# Patient Record
Sex: Female | Born: 1980 | Race: Black or African American | Hispanic: No | Marital: Married | Smoking: Never smoker
Health system: Southern US, Community
[De-identification: ages and names within clinical notes are randomized; demographics above are authoritative.]

## PROBLEM LIST (undated history)

## (undated) ENCOUNTER — Inpatient Hospital Stay (HOSPITAL_COMMUNITY): Payer: Self-pay

## (undated) DIAGNOSIS — Z789 Other specified health status: Secondary | ICD-10-CM

## (undated) DIAGNOSIS — Z973 Presence of spectacles and contact lenses: Secondary | ICD-10-CM

## (undated) DIAGNOSIS — J40 Bronchitis, not specified as acute or chronic: Secondary | ICD-10-CM

## (undated) DIAGNOSIS — U071 COVID-19: Secondary | ICD-10-CM

## (undated) HISTORY — PX: CERVICAL BIOPSY  W/ LOOP ELECTRODE EXCISION: SUR135

## (undated) HISTORY — PX: NO PAST SURGERIES: SHX2092

---

## 2011-12-06 ENCOUNTER — Encounter (HOSPITAL_COMMUNITY): Payer: Self-pay | Admitting: *Deleted

## 2011-12-06 ENCOUNTER — Emergency Department (INDEPENDENT_AMBULATORY_CARE_PROVIDER_SITE_OTHER): Payer: Self-pay

## 2011-12-06 ENCOUNTER — Emergency Department (INDEPENDENT_AMBULATORY_CARE_PROVIDER_SITE_OTHER)
Admission: EM | Admit: 2011-12-06 | Discharge: 2011-12-06 | Disposition: A | Discharge status: Home / Self Care | Payer: Self-pay | Source: Home / Self Care | Attending: Emergency Medicine | Admitting: Emergency Medicine

## 2011-12-06 DIAGNOSIS — J209 Acute bronchitis, unspecified: Secondary | ICD-10-CM

## 2011-12-06 HISTORY — DX: Bronchitis, not specified as acute or chronic: J40

## 2011-12-06 MED ORDER — BENZONATATE 200 MG PO CAPS: 200.0000 mg | ORAL_CAPSULE | Freq: Three times a day (TID) | ORAL | Status: DC | PRN

## 2011-12-06 MED ORDER — AMOXICILLIN 500 MG PO CAPS: 1000.0000 mg | ORAL_CAPSULE | Freq: Three times a day (TID) | ORAL | Status: DC

## 2011-12-06 NOTE — ED Notes (Signed)
Cough with slight fever onset the beginning of last week.  Also had body aches and hemoptysis today x 30.

## 2011-12-06 NOTE — ED Provider Notes (Signed)
Chief Complaint  Patient presents with  . Cough    History of Present Illness:   The patient is a 31 year old female who comes in today with her 2 sick children who have the same symptoms. She describes a one-week history of chills, myalgias, cough productive of sputum which is blood-tinged, chest tightness, wheezing, and sore throat. She denies any fever, nasal congestion, rhinorrhea, or GI symptoms.  Review of Systems:  Other than noted above, the patient denies any of the following symptoms. Systemic:  No fever, chills, sweats, fatigue, myalgias, headache, or anorexia. Eye:  No redness, pain or drainage. ENT:  No earache, ear congestion, nasal congestion, sneezing, rhinorrhea, sinus pressure, sinus pain, post nasal drip, or sore throat. Lungs:  No cough, sputum production, wheezing, shortness of breath, or chest pain. GI:  No abdominal pain, nausea, vomiting, or diarrhea.  PMFSH:  Past medical history, family history, social history, meds, and allergies were reviewed.  Physical Exam:   Vital signs:  BP 111/75  Pulse 92  Temp 98.2 F (36.8 C) (Oral)  SpO2 99%  LMP 11/26/2011 General:  Alert, in no distress. Eye:  No conjunctival injection or drainage. Lids were normal. ENT:  TMs and canals were normal, without erythema or inflammation.  Nasal mucosa was clear and uncongested, without drainage.  Mucous membranes were moist.  Pharynx was clear, without exudate or drainage.  There were no oral ulcerations or lesions. Neck:  Supple, no adenopathy, tenderness or mass. Lungs:  No respiratory distress.  Lungs were clear to auscultation, without wheezes, rales or rhonchi.  Breath sounds were clear and equal bilaterally.  Heart:  Regular rhythm, without gallops, murmers or rubs. Skin:  Clear, warm, and dry, without rash or lesions.  Radiology:  Dg Chest 2 View  12/06/2011  *RADIOLOGY REPORT*  Clinical Data: Hemoptysis.  CHEST - 2 VIEW  Comparison: None.  Findings: Cardiomediastinal  silhouette appears normal.  No acute pulmonary disease is noted.  Bony thorax is intact.  IMPRESSION: No acute cardiopulmonary abnormality seen.   Original Report Authenticated By: Lupita Raider.,  M.D.    I reviewed the images independently and personally and concur with the radiologist's findings.  Assessment:  The encounter diagnosis was Acute bronchitis.  Plan:   1.  The following meds were prescribed:   New Prescriptions   AMOXICILLIN (AMOXIL) 500 MG CAPSULE    Take 2 capsules (1,000 mg total) by mouth 3 (three) times daily.   BENZONATATE (TESSALON) 200 MG CAPSULE    Take 1 capsule (200 mg total) by mouth 3 (three) times daily as needed for cough.   2.  The patient was instructed in symptomatic care and handouts were given. 3.  The patient was told to return if becoming worse in any way, if no better in 3 or 4 days, and given some red flag symptoms that would indicate earlier return.   Reuben Likes, MD 12/06/11 2209

## 2017-07-20 ENCOUNTER — Inpatient Hospital Stay (HOSPITAL_COMMUNITY)
Admission: AD | Admit: 2017-07-20 | Discharge: 2017-07-20 | Disposition: A | Discharge status: Home / Self Care | Payer: Medicaid Other | Source: Ambulatory Visit | Attending: Obstetrics and Gynecology | Admitting: Obstetrics and Gynecology

## 2017-07-20 ENCOUNTER — Inpatient Hospital Stay (HOSPITAL_COMMUNITY): Payer: Medicaid Other

## 2017-07-20 ENCOUNTER — Encounter (HOSPITAL_COMMUNITY): Payer: Self-pay | Admitting: *Deleted

## 2017-07-20 DIAGNOSIS — O209 Hemorrhage in early pregnancy, unspecified: Secondary | ICD-10-CM | POA: Diagnosis not present

## 2017-07-20 DIAGNOSIS — O3680X0 Pregnancy with inconclusive fetal viability, not applicable or unspecified: Secondary | ICD-10-CM

## 2017-07-20 DIAGNOSIS — Z3A01 Less than 8 weeks gestation of pregnancy: Secondary | ICD-10-CM | POA: Diagnosis not present

## 2017-07-20 HISTORY — DX: Other specified health status: Z78.9

## 2017-07-20 LAB — HCG, QUANTITATIVE, PREGNANCY: hCG, Beta Chain, Quant, S: 1150 m[IU]/mL — ABNORMAL HIGH (ref <5)

## 2017-07-20 LAB — URINALYSIS, ROUTINE W REFLEX MICROSCOPIC
Bacteria, UA: NONE SEEN
Bilirubin Urine: NEGATIVE
Glucose, UA: NEGATIVE mg/dL
Ketones, ur: NEGATIVE mg/dL
Leukocytes, UA: NEGATIVE
Nitrite: NEGATIVE
Protein, ur: NEGATIVE mg/dL
Specific Gravity, Urine: 1.017 (ref 1.005–1.030)
pH: 5 (ref 5.0–8.0)

## 2017-07-20 LAB — CBC
HCT: 37.8 % (ref 36.0–46.0)
Hemoglobin: 12.6 g/dL (ref 12.0–15.0)
MCH: 29 pg (ref 26.0–34.0)
MCHC: 33.3 g/dL (ref 30.0–36.0)
MCV: 86.9 fL (ref 78.0–100.0)
Platelets: 221 10*3/uL (ref 150–400)
RBC: 4.35 MIL/uL (ref 3.87–5.11)
RDW: 13.1 % (ref 11.5–15.5)
WBC: 7 10*3/uL (ref 4.0–10.5)

## 2017-07-20 LAB — WET PREP, GENITAL
Clue Cells Wet Prep HPF POC: NONE SEEN
Sperm: NONE SEEN
Trich, Wet Prep: NONE SEEN
Yeast Wet Prep HPF POC: NONE SEEN

## 2017-07-20 LAB — ABO/RH: ABO/RH(D): AB POS

## 2017-07-20 LAB — POCT PREGNANCY, URINE: Preg Test, Ur: POSITIVE — AB

## 2017-07-20 NOTE — MAU Note (Signed)
Gina Finneratricia Welch is a 37 y.o. at Unknown here in MAU reporting:  +vaginal bleeding Spotting in nature; noticeable when wipes Brown in color Last intercourse in past 24 hours LMP: 06/15/17 Onset of complaint: this am Pain score: denies at this time Vitals:   07/20/17 0837  BP: 129/79  Pulse: 87  Resp: 18  Temp: 97.8 F (36.6 C)  SpO2: 98%      Lab orders placed from triage: ua & pregnancy test

## 2017-07-20 NOTE — Discharge Instructions (Signed)
Ectopic Pregnancy An ectopic pregnancy is when the fertilized egg attaches (implants) outside the uterus. Most ectopic pregnancies occur in one of the tubes where eggs travel from the ovary to the uterus (fallopian tubes), but the implanting can occur in other locations. In rare cases, ectopic pregnancies occur on the ovary, intestine, pelvis, abdomen, or cervix. In an ectopic pregnancy, the fertilized egg does not have the ability to develop into a normal, healthy baby. A ruptured ectopic pregnancy is one in which tearing or bursting of a fallopian tube causes internal bleeding. Often, there is intense lower abdominal pain, and vaginal bleeding sometimes occurs. Having an ectopic pregnancy can be life-threatening. If this dangerous condition is not treated, it can lead to blood loss, shock, or even death. What are the causes? The most common cause of this condition is damage to one of the fallopian tubes. A fallopian tube may be narrowed or blocked, and that keeps the fertilized egg from reaching the uterus. What increases the risk? This condition is more likely to develop in women of childbearing age who have different levels of risk. The levels of risk can be divided into three categories. High risk  You have gone through infertility treatment.  You have had an ectopic pregnancy before.  You have had surgery on the fallopian tubes, or another surgical procedure, such as an abortion.  You have had surgery to have the fallopian tubes tied (tubal ligation).  You have problems or diseases of the fallopian tubes.  You have been exposed to diethylstilbestrol (DES). This medicine was used until 1971, and it had effects on babies whose mothers took the medicine.  You become pregnant while using an IUD (intrauterine device) for birth control. Moderate risk  You have a history of infertility.  You have had an STI (sexually transmitted infection).  You have a history of pelvic inflammatory  disease (PID).  You have scarring from endometriosis.  You have multiple sexual partners.  You smoke. Low risk  You have had pelvic surgery.  You use vaginal douches.  You became sexually active before age 18. What are the signs or symptoms? Common symptoms of this condition include normal pregnancy symptoms, such as missing a period, nausea, tiredness, abdominal pain, breast tenderness, and bleeding. However, ectopic pregnancy will have additional symptoms, such as:  Pain with intercourse.  Irregular vaginal bleeding or spotting.  Cramping or pain on one side or in the lower abdomen.  Fast heartbeat, low blood pressure, and sweating.  Passing out while having a bowel movement.  Symptoms of a ruptured ectopic pregnancy and internal bleeding may include:  Sudden, severe pain in the abdomen and pelvis.  Dizziness, weakness, light-headedness, or fainting.  Pain in the shoulder or neck area.  How is this diagnosed? This condition is diagnosed by:  A pelvic exam to locate pain or a mass in the abdomen.  A pregnancy test. This blood test checks for the presence as well as the specific level of pregnancy hormone in the bloodstream.  Ultrasound. This is performed if a pregnancy test is positive. In this test, a probe is inserted into the vagina. The probe will detect a fetus, possibly in a location other than the uterus.  Taking a sample of uterus tissue (dilation and curettage, or D&C).  Surgery to perform a visual exam of the inside of the abdomen using a thin, lighted tube that has a tiny camera on the end (laparoscope).  Culdocentesis. This procedure involves inserting a needle at the top   of the vagina, behind the uterus. If blood is present in this area, it may indicate that a fallopian tube is torn.  How is this treated? This condition is treated with medicine or surgery. Medicine  An injection of a medicine (methotrexate) may be given to cause the pregnancy tissue  to be absorbed. This medicine may save your fallopian tube. It may be given if: ? The diagnosis is made early, with no signs of active bleeding. ? The fallopian tube has not ruptured. ? You are considered to be a good candidate for the medicine. Usually, pregnancy hormone blood levels are checked after methotrexate treatment. This is to be sure that the medicine is effective. It may take 4-6 weeks for the pregnancy to be absorbed. Most pregnancies will be absorbed by 3 weeks. Surgery  A laparoscope may be used to remove the pregnancy tissue.  If severe internal bleeding occurs, a larger cut (incision) may be made in the lower abdomen (laparotomy) to remove the fetus and placenta. This is done to stop the bleeding.  Part or all of the fallopian tube may be removed (salpingectomy) along with the fetus and placenta. The fallopian tube may also be repaired during the surgery.  In very rare circumstances, removal of the uterus (hysterectomy) may be required.  After surgery, pregnancy hormone testing may be done to be sure that there is no pregnancy tissue left. Whether your treatment is medicine or surgery, you may receive a Rho (D) immune globulin shot to prevent problems with any future pregnancy. This shot may be given if:  You are Rh-negative and the baby's father is Rh-positive.  You are Rh-negative and you do not know the Rh type of the baby's father.  Follow these instructions at home:  Rest and limit your activity after the procedure for as long as told by your health care provider.  Until your health care provider says that it is safe: ? Do not lift anything that is heavier than 10 lb (4.5 kg), or the limit that your health care provider tells you. ? Avoid physical exercise and any movement that requires effort (is strenuous).  To help prevent constipation: ? Eat a healthy diet that includes fruits, vegetables, and whole grains. ? Drink 6-8 glasses of water per day. Get help  right away if:  You develop worsening pain that is not relieved by medicine.  You have: ? A fever or chills. ? Vaginal bleeding. ? Redness and swelling at the incision site. ? Nausea and vomiting.  You feel dizzy or weak.  You feel light-headed or you faint. This information is not intended to replace advice given to you by your health care provider. Make sure you discuss any questions you have with your health care provider. Document Released: 01/26/2004 Document Revised: 08/17/2015 Document Reviewed: 07/20/2015 Elsevier Interactive Patient Education  2018 Elsevier Inc.  

## 2017-07-20 NOTE — MAU Note (Signed)
Blind swabs collected by cnm

## 2017-07-20 NOTE — MAU Provider Note (Signed)
History     CSN: [id number]  Arrival date and time: 07/20/17 4098   First Provider Initiated Contact with Patient 07/20/17 0900      Chief Complaint  Patient presents with  . Vaginal Bleeding   Vaginal Bleeding  The patient's primary symptoms include vaginal bleeding. The patient's pertinent negatives include no pelvic pain or vaginal discharge. This is a new problem. The current episode started today. The problem occurs intermittently. The problem has been unchanged. The patient is experiencing no pain. She is pregnant. Pertinent negatives include no dysuria, fever, frequency, nausea or vomiting. The vaginal discharge was brown. The vaginal bleeding is spotting. She has not been passing clots. She has not been passing tissue. The symptoms are aggravated by intercourse. She has tried nothing for the symptoms. She is sexually active. She uses nothing for contraception. Her menstrual history has been regular (LMP 06/15/17 (approx)).   Past Medical History:  Diagnosis Date  . Bronchitis   . Medical history non-contributory     Past Surgical History:  Procedure Laterality Date  . NO PAST SURGERIES      Family History  Problem Relation Age of Onset  . Hypertension Mother   . Cancer Maternal Grandmother   . Depression Paternal Grandfather     Social History   Tobacco Use  . Smoking status: Never Smoker  . Smokeless tobacco: Never Used  Substance Use Topics  . Alcohol use: No  . Drug use: No    Allergies: No Known Allergies  Medications Prior to Admission  Medication Sig Dispense Refill Last Dose  . amoxicillin (AMOXIL) 500 MG capsule Take 2 capsules (1,000 mg total) by mouth 3 (three) times daily. 60 capsule 0   . benzonatate (TESSALON) 200 MG capsule Take 1 capsule (200 mg total) by mouth 3 (three) times daily as needed for cough. 30 capsule 0     Review of Systems  Constitutional: Negative for fatigue and fever.  Gastrointestinal: Negative for nausea and vomiting.   Genitourinary: Positive for vaginal bleeding. Negative for dysuria, frequency, pelvic pain and vaginal discharge.   Physical Exam   Blood pressure 129/79, pulse 87, temperature 97.8 F (36.6 C), temperature source Oral, resp. rate 18, weight 163 lb (73.9 kg), last menstrual period 06/15/2017, SpO2 98 %.  Physical Exam  Nursing note and vitals reviewed. Constitutional: She is oriented to person, place, and time. She appears well-developed and well-nourished. No distress.  HENT:  Head: Normocephalic.  Cardiovascular: Normal rate.  Respiratory: Effort normal.  GI: Soft. There is no tenderness. There is no rebound.  Neurological: She is alert and oriented to person, place, and time.  Skin: Skin is warm and dry.  Psychiatric: She has a normal mood and affect.     Results for orders placed or performed during the hospital encounter of 07/20/17 (from the past 24 hour(s))  Urinalysis, Routine w reflex microscopic     Status: Abnormal   Collection Time: 07/20/17  8:43 AM  Result Value Ref Range   Color, Urine YELLOW YELLOW   APPearance CLEAR CLEAR   Specific Gravity, Urine 1.017 1.005 - 1.030   pH 5.0 5.0 - 8.0   Glucose, UA NEGATIVE NEGATIVE mg/dL   Hgb urine dipstick SMALL (A) NEGATIVE   Bilirubin Urine NEGATIVE NEGATIVE   Ketones, ur NEGATIVE NEGATIVE mg/dL   Protein, ur NEGATIVE NEGATIVE mg/dL   Nitrite NEGATIVE NEGATIVE   Leukocytes, UA NEGATIVE NEGATIVE   RBC / HPF 0-5 0 - 5 RBC/hpf   WBC, UA  0-5 0 - 5 WBC/hpf   Bacteria, UA NONE SEEN NONE SEEN   Squamous Epithelial / LPF 0-5 0 - 5   Mucus PRESENT   Wet prep, genital     Status: Abnormal   Collection Time: 07/20/17  9:13 AM  Result Value Ref Range   Yeast Wet Prep HPF POC NONE SEEN NONE SEEN   Trich, Wet Prep NONE SEEN NONE SEEN   Clue Cells Wet Prep HPF POC NONE SEEN NONE SEEN   WBC, Wet Prep HPF POC FEW (A) NONE SEEN   Sperm NONE SEEN   Pregnancy, urine POC     Status: Abnormal   Collection Time: 07/20/17  9:16 AM   Result Value Ref Range   Preg Test, Ur POSITIVE (A) NEGATIVE  CBC     Status: None   Collection Time: 07/20/17  9:27 AM  Result Value Ref Range   WBC 7.0 4.0 - 10.5 K/uL   RBC 4.35 3.87 - 5.11 MIL/uL   Hemoglobin 12.6 12.0 - 15.0 g/dL   HCT 16.1 09.6 - 04.5 %   MCV 86.9 78.0 - 100.0 fL   MCH 29.0 26.0 - 34.0 pg   MCHC 33.3 30.0 - 36.0 g/dL   RDW 40.9 81.1 - 91.4 %   Platelets 221 150 - 400 K/uL  hCG, quantitative, pregnancy     Status: Abnormal   Collection Time: 07/20/17  9:27 AM  Result Value Ref Range   hCG, Beta Chain, Quant, S 1,150 (H) <5 mIU/mL  ABO/Rh     Status: None (Preliminary result)   Collection Time: 07/20/17  9:27 AM  Result Value Ref Range   ABO/RH(D)      AB POS Performed at [facility], [address] [place], [place] [address]    US Ob Comp Less 14 Wks  Result Date: 07/20/2017 CLINICAL DATA:  Small amount of vaginal bleeding and right pelvic tenderness. Approximately 5 weeks and 0 days pregnant by last menstrual period. The patient is not sure of when the last menstrual period started. Quantitative beta HCG 1,150. EXAM: OBSTETRIC <14 WK [place] AND TRANSVAGINAL OB US TECHNIQUE: Both transabdominal and transvaginal ultrasound examinations were performed for complete evaluation of the gestation as well as the maternal uterus, adnexal regions, and pelvic cul-de-sac. Transvaginal technique was performed to assess early pregnancy. COMPARISON:  None. FINDINGS: Intrauterine gestational sac: Small oval fluid collection in the endometrial cavity with an appearance suggesting a small, early gestational sac. Yolk sac:  Not visualized Embryo:  Not visualized MSD: 2.3 mm   4 w   6 d Subchorionic hemorrhage:  None visualized. Maternal uterus/adnexae: 3.7 cm simple right ovarian corpus luteum cyst. Normal appearing left ovary. Trace amount of free peritoneal fluid. IMPRESSION: Probable early intrauterine gestational sac, but no yolk sac, fetal pole, or cardiac activity yet  visualized. Recommend follow-up quantitative B-HCG levels and follow-up US in 14 days to assess viability. This recommendation follows SRU consensus guidelines: Diagnostic Criteria for Nonviable Pregnancy Early in the First Trimester. Malva Limes Med 2013; 621:3086-57. Electronically Signed   By: Beckie Salts M.D.   On: 07/20/2017 10:40   US Ob Transvaginal  Result Date: 07/20/2017 CLINICAL DATA:  Small amount of vaginal bleeding and right pelvic tenderness. Approximately 5 weeks and 0 days pregnant by last menstrual period. The patient is not sure of when the last menstrual period started. Quantitative beta HCG 1,150. EXAM: OBSTETRIC <14 WK [place] AND TRANSVAGINAL OB US TECHNIQUE: Both transabdominal and transvaginal ultrasound examinations were  performed for complete evaluation of the gestation as well as the maternal uterus, adnexal regions, and pelvic cul-de-sac. Transvaginal technique was performed to assess early pregnancy. COMPARISON:  None. FINDINGS: Intrauterine gestational sac: Small oval fluid collection in the endometrial cavity with an appearance suggesting a small, early gestational sac. Yolk sac:  Not visualized Embryo:  Not visualized MSD: 2.3 mm   4 w   6 d Subchorionic hemorrhage:  None visualized. Maternal uterus/adnexae: 3.7 cm simple right ovarian corpus luteum cyst. Normal appearing left ovary. Trace amount of free peritoneal fluid. IMPRESSION: Probable early intrauterine gestational sac, but no yolk sac, fetal pole, or cardiac activity yet visualized. Recommend follow-up quantitative B-HCG levels and follow-up US in 14 days to assess viability. This recommendation follows SRU consensus guidelines: Diagnostic Criteria for Nonviable Pregnancy Early in the First Trimester. Malva Limes Engl J Med 2013; 161:0960-45; 369:1443-51. Electronically Signed   By: Beckie SaltsSteven  Reid M.D.   On: 07/20/2017 10:40   MAU Course  Procedures  MDM   Assessment and Plan   1. Pregnancy, location unknown   2. Vaginal bleeding in  pregnancy, first trimester   3. [redacted] weeks gestation of pregnancy    DC home Comfort measures reviewed  1st Trimester precautions  Bleeding precautions Ectopic precautions RX: none  Return to MAU as needed FU with OB as planned  Follow-up Information    [facility].   Specialty:  Obstetrics and Gynecology Why:  Monday at 5:15 for blood work Contact information: [address] [place] [id number] [phone]           Thressa ShellerHeather Koji Niehoff 07/20/2017, 11:38 AM

## 2017-07-22 ENCOUNTER — Inpatient Hospital Stay (HOSPITAL_COMMUNITY)
Admission: AD | Admit: 2017-07-22 | Discharge: 2017-07-22 | Disposition: A | Discharge status: Home / Self Care | Payer: Medicaid Other | Source: Ambulatory Visit | Attending: Obstetrics and Gynecology | Admitting: Obstetrics and Gynecology

## 2017-07-22 ENCOUNTER — Ambulatory Visit (INDEPENDENT_AMBULATORY_CARE_PROVIDER_SITE_OTHER): Payer: Self-pay | Admitting: *Deleted

## 2017-07-22 DIAGNOSIS — O3680X0 Pregnancy with inconclusive fetal viability, not applicable or unspecified: Secondary | ICD-10-CM

## 2017-07-22 DIAGNOSIS — O283 Abnormal ultrasonic finding on antenatal screening of mother: Secondary | ICD-10-CM

## 2017-07-22 LAB — GC/CHLAMYDIA PROBE AMP (~~LOC~~) NOT AT ARMC
Chlamydia: NEGATIVE
Neisseria Gonorrhea: NEGATIVE

## 2017-07-22 LAB — HCG, QUANTITATIVE, PREGNANCY: hCG, Beta Chain, Quant, S: 2805 m[IU]/mL — ABNORMAL HIGH (ref <5)

## 2017-07-22 NOTE — Progress Notes (Signed)
Pt denies vaginal bleeding or abdominal/pelvic pain. Stat BHCG drawn  1915 Results reviewed by Thressa ShellerHeather Hogan, CNM. Pt informed of results and recommendation for US in 2 weeks to assess pregnancy progression. Pt advised to return immediately if she develops heavy vaginal bleeding or pelvic/abdominal pain. Pt voiced understanding and will be notified of US appt via MyChart.

## 2017-07-22 NOTE — MAU Note (Signed)
PT sent to clinic for labs

## 2017-07-23 ENCOUNTER — Encounter: Payer: Self-pay | Admitting: *Deleted

## 2017-07-23 NOTE — Progress Notes (Signed)
I have reviewed the chart and agree with nursing staff's documentation of this patient's encounter.  Thressa ShellerHeather Hogan, CNM 07/23/2017 1:05 PM

## 2017-08-05 ENCOUNTER — Ambulatory Visit (HOSPITAL_COMMUNITY): Payer: Medicaid Other

## 2017-08-06 ENCOUNTER — Ambulatory Visit (HOSPITAL_COMMUNITY): Payer: Medicaid Other

## 2017-08-08 ENCOUNTER — Encounter: Payer: Self-pay | Admitting: Family Medicine

## 2017-08-08 ENCOUNTER — Ambulatory Visit (INDEPENDENT_AMBULATORY_CARE_PROVIDER_SITE_OTHER): Payer: Medicaid Other | Admitting: *Deleted

## 2017-08-08 ENCOUNTER — Ambulatory Visit (HOSPITAL_COMMUNITY): Payer: Medicaid Other

## 2017-08-08 ENCOUNTER — Ambulatory Visit (HOSPITAL_COMMUNITY)
Admission: RE | Admit: 2017-08-08 | Discharge: 2017-08-08 | Disposition: A | Discharge status: Home / Self Care | Payer: Medicaid Other | Source: Ambulatory Visit | Attending: Advanced Practice Midwife | Admitting: Advanced Practice Midwife

## 2017-08-08 DIAGNOSIS — O208 Other hemorrhage in early pregnancy: Secondary | ICD-10-CM | POA: Diagnosis not present

## 2017-08-08 DIAGNOSIS — O09521 Supervision of elderly multigravida, first trimester: Secondary | ICD-10-CM | POA: Diagnosis not present

## 2017-08-08 DIAGNOSIS — O3680X0 Pregnancy with inconclusive fetal viability, not applicable or unspecified: Secondary | ICD-10-CM | POA: Diagnosis not present

## 2017-08-08 DIAGNOSIS — Z3A01 Less than 8 weeks gestation of pregnancy: Secondary | ICD-10-CM | POA: Diagnosis not present

## 2017-08-08 NOTE — Progress Notes (Signed)
Chart reviewed - agree with RN documentation.   

## 2017-08-08 NOTE — Progress Notes (Signed)
Here for Gina Welch results. Reviewed with Dr. Adrian BlackwaterSTinson and informed patient Gina Welch shows live baby [id number] . We recommend she start prenatal care provider her choice, come back to mau for bleeding, etc. Patient voices understanding.

## 2017-08-09 ENCOUNTER — Ambulatory Visit (HOSPITAL_COMMUNITY): Payer: Medicaid Other

## 2017-08-27 ENCOUNTER — Encounter: Payer: Self-pay | Admitting: General Practice

## 2017-08-27 ENCOUNTER — Ambulatory Visit: Payer: Medicaid Other | Admitting: *Deleted

## 2017-08-27 ENCOUNTER — Other Ambulatory Visit: Payer: Self-pay

## 2017-08-27 DIAGNOSIS — Z3482 Encounter for supervision of other normal pregnancy, second trimester: Secondary | ICD-10-CM | POA: Diagnosis not present

## 2017-08-27 DIAGNOSIS — Z3480 Encounter for supervision of other normal pregnancy, unspecified trimester: Secondary | ICD-10-CM

## 2017-08-27 LAB — POCT URINALYSIS DIPSTICK OB
Bilirubin, UA: NEGATIVE
Glucose, UA: NEGATIVE
Ketones, UA: NEGATIVE
Nitrite, UA: NEGATIVE
POC,PROTEIN,UA: NEGATIVE
Spec Grav, UA: 1.025 (ref 1.010–1.025)
Urobilinogen, UA: 0.2 E.U./dL
pH, UA: 5.5 (ref 5.0–8.0)

## 2017-08-27 NOTE — Progress Notes (Signed)
    PRENATAL INTAKE SUMMARY  [name] presents today New OB Nurse Interview.  OB History    Gravida  3   Para  2   Term  2   Preterm      AB      Living  2     SAB      TAB      Ectopic      Multiple      Live Births  2          I have reviewed the patient's medical, obstetrical, social, and family histories, medications, and available lab results.  SUBJECTIVE She has no unusual complaints.  OBJECTIVE Initial Nurse Interview for Lab work and History (New OB).   GENERAL APPEARANCE: alert, well appearing.   ASSESSMENT Normal pregnancy.  EDD: 03/22/2018 and GA: [id number].  PLAN Prenatal care-[facility] Pnl/HIV  OB Urine Culture GC/CT/PAP at next visit with CNM. HgbEval SMA CF A1C Glucose   Clovis PuMartin, Sumit Branham L, RN

## 2017-08-27 NOTE — Progress Notes (Signed)
I have reviewed the chart and agree with nursing staff's documentation of this patient's encounter.  Raelyn MoraRolitta Efosa Treichler, CNM 08/27/2017 2:47 PM

## 2017-08-29 LAB — URINE CULTURE, OB REFLEX

## 2017-08-29 LAB — CULTURE, OB URINE

## 2017-09-03 ENCOUNTER — Encounter: Payer: Self-pay | Admitting: General Practice

## 2017-09-03 LAB — HEMOGLOBINOPATHY EVALUATION
Ferritin: 13 ng/mL — ABNORMAL LOW (ref 15–150)
Hgb A2 Quant: 2.5 % (ref 1.8–3.2)
Hgb A: 97.5 % (ref 96.4–98.8)
Hgb C: 0 %
Hgb F Quant: 0 % (ref 0.0–2.0)
Hgb S: 0 %
Hgb Solubility: NEGATIVE
Hgb Variant: 0 %

## 2017-09-03 LAB — OBSTETRIC PANEL, INCLUDING HIV
Antibody Screen: NEGATIVE
Basophils Absolute: 0 10*3/uL (ref 0.0–0.2)
Basos: 0 %
EOS (ABSOLUTE): 0.2 10*3/uL (ref 0.0–0.4)
Eos: 2 %
HIV Screen 4th Generation wRfx: NONREACTIVE
Hematocrit: 37.3 % (ref 34.0–46.6)
Hemoglobin: 12 g/dL (ref 11.1–15.9)
Hepatitis B Surface Ag: NEGATIVE
Immature Grans (Abs): 0 10*3/uL (ref 0.0–0.1)
Immature Granulocytes: 0 %
Lymphocytes Absolute: 2.3 10*3/uL (ref 0.7–3.1)
Lymphs: 21 %
MCH: 28.6 pg (ref 26.6–33.0)
MCHC: 32.2 g/dL (ref 31.5–35.7)
MCV: 89 fL (ref 79–97)
Monocytes Absolute: 0.4 10*3/uL (ref 0.1–0.9)
Monocytes: 4 %
Neutrophils Absolute: 7.9 10*3/uL — ABNORMAL HIGH (ref 1.4–7.0)
Neutrophils: 73 %
Platelets: 233 10*3/uL (ref 150–450)
RBC: 4.2 x10E6/uL (ref 3.77–5.28)
RDW: 14 % (ref 12.3–15.4)
RPR Ser Ql: NONREACTIVE
Rh Factor: POSITIVE
Rubella Antibodies, IGG: 10.1 index (ref >0.99)
WBC: 10.8 10*3/uL (ref 3.4–10.8)

## 2017-09-03 LAB — HEMOGLOBIN A1C
Est. average glucose Bld gHb Est-mCnc: 111 mg/dL
Hgb A1c MFr Bld: 5.5 % (ref 4.8–5.6)

## 2017-09-03 LAB — CYSTIC FIBROSIS MUTATION 97: Interpretation: NOT DETECTED

## 2017-09-07 LAB — SMN1 COPY NUMBER ANALYSIS (SMA CARRIER SCREENING)

## 2017-09-08 ENCOUNTER — Other Ambulatory Visit: Payer: Self-pay

## 2017-09-08 ENCOUNTER — Inpatient Hospital Stay (HOSPITAL_COMMUNITY)
Admission: AD | Admit: 2017-09-08 | Discharge: 2017-09-09 | Discharge status: Against Medical Advice | Payer: BLUE CROSS/BLUE SHIELD | Source: Ambulatory Visit | Attending: Obstetrics and Gynecology | Admitting: Obstetrics and Gynecology

## 2017-09-08 DIAGNOSIS — Z3482 Encounter for supervision of other normal pregnancy, second trimester: Secondary | ICD-10-CM | POA: Diagnosis not present

## 2017-09-08 DIAGNOSIS — Z3480 Encounter for supervision of other normal pregnancy, unspecified trimester: Secondary | ICD-10-CM

## 2017-09-08 LAB — URINALYSIS, ROUTINE W REFLEX MICROSCOPIC
Bilirubin Urine: NEGATIVE
Glucose, UA: NEGATIVE mg/dL
Hgb urine dipstick: NEGATIVE
Ketones, ur: NEGATIVE mg/dL
Leukocytes, UA: NEGATIVE
Nitrite: NEGATIVE
Protein, ur: NEGATIVE mg/dL
Specific Gravity, Urine: 1.025 (ref 1.005–1.030)
pH: 5 (ref 5.0–8.0)

## 2017-09-08 NOTE — MAU Note (Signed)
Pt reports back spasms that radiate down to L side tried icy hot and warm compress last night with little relief. Pt has an appointment on 9/12, but pain was too bad to wait. Pt took tylenol ES x2 tabs yesterday with no relief.

## 2017-09-09 DIAGNOSIS — Z3482 Encounter for supervision of other normal pregnancy, second trimester: Secondary | ICD-10-CM | POA: Diagnosis not present

## 2017-09-12 ENCOUNTER — Ambulatory Visit (INDEPENDENT_AMBULATORY_CARE_PROVIDER_SITE_OTHER): Payer: BLUE CROSS/BLUE SHIELD | Admitting: Obstetrics and Gynecology

## 2017-09-12 ENCOUNTER — Other Ambulatory Visit (HOSPITAL_COMMUNITY)
Admission: RE | Admit: 2017-09-12 | Discharge: 2017-09-12 | Disposition: A | Discharge status: Home / Self Care | Payer: BLUE CROSS/BLUE SHIELD | Source: Ambulatory Visit | Attending: Obstetrics and Gynecology | Admitting: Obstetrics and Gynecology

## 2017-09-12 VITALS — BP 125/70 | HR 94 | Wt 175.0 lb

## 2017-09-12 DIAGNOSIS — M549 Dorsalgia, unspecified: Secondary | ICD-10-CM | POA: Diagnosis not present

## 2017-09-12 DIAGNOSIS — O09521 Supervision of elderly multigravida, first trimester: Secondary | ICD-10-CM | POA: Diagnosis not present

## 2017-09-12 DIAGNOSIS — O99891 Other specified diseases and conditions complicating pregnancy: Secondary | ICD-10-CM

## 2017-09-12 DIAGNOSIS — Z3A12 12 weeks gestation of pregnancy: Secondary | ICD-10-CM | POA: Diagnosis not present

## 2017-09-12 DIAGNOSIS — Z3482 Encounter for supervision of other normal pregnancy, second trimester: Secondary | ICD-10-CM | POA: Diagnosis not present

## 2017-09-12 DIAGNOSIS — Z3481 Encounter for supervision of other normal pregnancy, first trimester: Secondary | ICD-10-CM | POA: Diagnosis present

## 2017-09-12 DIAGNOSIS — O9989 Other specified diseases and conditions complicating pregnancy, childbirth and the puerperium: Secondary | ICD-10-CM | POA: Diagnosis not present

## 2017-09-12 DIAGNOSIS — Z3480 Encounter for supervision of other normal pregnancy, unspecified trimester: Secondary | ICD-10-CM

## 2017-09-12 MED ORDER — CYCLOBENZAPRINE HCL 10 MG PO TABS: 10.0000 mg | ORAL_TABLET | Freq: Two times a day (BID) | ORAL | 2 refills | Status: DC | PRN

## 2017-09-12 MED ORDER — COMFORT FIT MATERNITY SUPP SM MISC: 1.0000 [IU] | Freq: Every day | 0 refills | Status: DC | PRN

## 2017-09-12 MED ORDER — PRENATAL GUMMIES/DHA & FA 0.4-32.5 MG PO CHEW: 3.0000 | CHEWABLE_TABLET | Freq: Every day | ORAL | 12 refills | Status: DC

## 2017-09-12 NOTE — Patient Instructions (Addendum)
Cyclobenzaprine tablets What is this medicine? CYCLOBENZAPRINE (sye kloe BEN za preen) is a muscle relaxer. It is used to treat muscle pain, spasms, and stiffness. This medicine may be used for other purposes; ask your health care provider or pharmacist if you have questions. COMMON BRAND NAME(S): Fexmid, Flexeril What should I tell my health care provider before I take this medicine? They need to know if you have any of these conditions: -heart disease, irregular heartbeat, or previous heart attack -liver disease -thyroid problem -an unusual or allergic reaction to cyclobenzaprine, tricyclic antidepressants, lactose, other medicines, foods, dyes, or preservatives -pregnant or trying to get pregnant -breast-feeding How should I use this medicine? Take this medicine by mouth with a glass of water. Follow the directions on the prescription label. If this medicine upsets your stomach, take it with food or milk. Take your medicine at regular intervals. Do not take it more often than directed. Talk to your pediatrician regarding the use of this medicine in children. Special care may be needed. Overdosage: If you think you have taken too much of this medicine contact a poison control center or emergency room at once. NOTE: This medicine is only for you. Do not share this medicine with others. What if I miss a dose? If you miss a dose, take it as soon as you can. If it is almost time for your next dose, take only that dose. Do not take double or extra doses. What may interact with this medicine? Do not take this medicine with any of the following medications: -certain medicines for fungal infections like fluconazole, itraconazole, ketoconazole, posaconazole, voriconazole -cisapride -dofetilide -dronedarone -halofantrine -levomethadyl -MAOIs like Carbex, Eldepryl, Marplan, Nardil, and Parnate -narcotic medicines for cough -pimozide -thioridazine -ziprasidone This medicine may also interact  with the following medications: -alcohol -antihistamines for allergy, cough and cold -certain medicines for anxiety or sleep -certain medicines for cancer -certain medicines for depression like amitriptyline, fluoxetine, sertraline -certain medicines for infection like alfuzosin, chloroquine, clarithromycin, levofloxacin, mefloquine, pentamidine, troleandomycin -certain medicines for irregular heart beat -certain medicines for seizures like phenobarbital, primidone -contrast dyes -general anesthetics like halothane, isoflurane, methoxyflurane, propofol -local anesthetics like lidocaine, pramoxine, tetracaine -medicines that relax muscles for surgery -narcotic medicines for pain -other medicines that prolong the QT interval (cause an abnormal heart rhythm) -phenothiazines like chlorpromazine, mesoridazine, prochlorperazine This list may not describe all possible interactions. Give your health care provider a list of all the medicines, herbs, non-prescription drugs, or dietary supplements you use. Also tell them if you smoke, drink alcohol, or use illegal drugs. Some items may interact with your medicine. What should I watch for while using this medicine? Tell your doctor or health care professional if your symptoms do not start to get better or if they get worse. You may get drowsy or dizzy. Do not drive, use machinery, or do anything that needs mental alertness until you know how this medicine affects you. Do not stand or sit up quickly, especially if you are an older patient. This reduces the risk of dizzy or fainting spells. Alcohol may interfere with the effect of this medicine. Avoid alcoholic drinks. If you are taking another medicine that also causes drowsiness, you may have more side effects. Give your health care provider a list of all medicines you use. Your doctor will tell you how much medicine to take. Do not take more medicine than directed. Call emergency for help if you have  problems breathing or unusual sleepiness. Your mouth may get dry. Chewing   sugarless gum or sucking hard candy, and drinking plenty of water may help. Contact your doctor if the problem does not go away or is severe. What side effects may I notice from receiving this medicine? Side effects that you should report to your doctor or health care professional as soon as possible: -allergic reactions like skin rash, itching or hives, swelling of the face, lips, or tongue -breathing problems -chest pain -fast, irregular heartbeat -hallucinations -seizures -unusually weak or tired Side effects that usually do not require medical attention (report to your doctor or health care professional if they continue or are bothersome): -headache -nausea, vomiting This list may not describe all possible side effects. Call your doctor for medical advice about side effects. You may report side effects to FDA at 1-800-FDA-1088. Where should I keep my medicine? Keep out of the reach of children. Store at room temperature between 15 and 30 degrees C (59 and 86 degrees F). Keep container tightly closed. Throw away any unused medicine after the expiration date. NOTE: This sheet is a summary. It may not cover all possible information. If you have questions about this medicine, talk to your doctor, pharmacist, or health care provider.  2018 Elsevier/Gold Standard (2014-09-28 12:05:46) Back Pain in Pregnancy Back pain during pregnancy is common. Back pain may be caused by several factors that are related to changes during your pregnancy. Follow these instructions at home: Managing pain, stiffness, and swelling  If directed, apply ice for sudden (acute) back pain. ? Put ice in a plastic bag. ? Place a towel between your skin and the bag. ? Leave the ice on for 20 minutes, 2-3 times per day.  If directed, apply heat to the affected area before you exercise: ? Place a towel between your skin and the heat pack or  heating pad. ? Leave the heat on for 20-30 minutes. ? Remove the heat if your skin turns bright red. This is especially important if you are unable to feel pain, heat, or cold. You may have a greater risk of getting burned. Activity  Exercise as told by your health care provider. Exercising is the best way to prevent or manage back pain.  Listen to your body when lifting. If lifting hurts, ask for help or bend your knees. This uses your leg muscles instead of your back muscles.  Squat down when picking up something from the floor. Do not bend over.  Only use bed rest as told by your health care provider. Bed rest should only be used for the most severe episodes of back pain. Standing, Sitting, and Lying Down  Do not stand in one place for long periods of time.  Use good posture when sitting. Make sure your head rests over your shoulders and is not hanging forward. Use a pillow on your lower back if necessary.  Try sleeping on your side, preferably the left side, with a pillow or two between your legs. If you are sore after a night's rest, your bed may be too soft. A firm mattress may provide more support for your back during pregnancy. General instructions  Do not wear high heels.  Eat a healthy diet. Try to gain weight within your health care provider's recommendations.  Use a maternity girdle, elastic sling, or back brace as told by your health care provider.  Take over-the-counter and prescription medicines only as told by your health care provider.  Keep all follow-up visits as told by your health care provider. This is important. This  includes any visits with any specialists, such as a physical therapist. Contact a health care provider if:  Your back pain interferes with your daily activities.  You have increasing pain in other parts of your body. Get help right away if:  You develop numbness, tingling, weakness, or problems with the use of your arms or legs.  You develop  severe back pain that is not controlled with medicine.  You have a sudden change in bowel or bladder control.  You develop shortness of breath, dizziness, or you faint.  You develop nausea, vomiting, or sweating.  You have back pain that is a rhythmic, cramping pain similar to labor pains. Labor pain is usually 1-2 minutes apart, lasts for about 1 minute, and involves a bearing down feeling or pressure in your pelvis.  You have back pain and your water breaks or you have vaginal bleeding.  You have back pain or numbness that travels down your leg.  Your back pain developed after you fell.  You develop pain on one side of your back.  You see blood in your urine.  You develop skin blisters in the area of your back pain. This information is not intended to replace advice given to you by your health care provider. Make sure you discuss any questions you have with your health care provider. Document Released: 03/28/2005 Document Revised: 05/26/2015 Document Reviewed: 09/01/2014 Elsevier Interactive Patient Education  2018 [employer]  Second Trimester of Pregnancy The second trimester is from week 13 through week 28, month 4 through 6. This is often the time in pregnancy that you feel your best. Often times, morning sickness has lessened or quit. You may have more energy, and you may get hungry more often. Your unborn baby (fetus) is growing rapidly. At the end of the sixth month, he or she is about 9 inches long and weighs about 1 pounds. You will likely feel the baby move (quickening) between 18 and 20 weeks of pregnancy. Follow these instructions at home:  Avoid all smoking, herbs, and alcohol. Avoid drugs not approved by your doctor.  Do not use any tobacco products, including cigarettes, chewing tobacco, and electronic cigarettes. If you need help quitting, ask your doctor. You may get counseling or other support to help you quit.  Only take medicine as told by your doctor. Some  medicines are safe and some are not during pregnancy.  Exercise only as told by your doctor. Stop exercising if you start having cramps.  Eat regular, healthy meals.  Wear a good support bra if your breasts are tender.  Do not use hot tubs, steam rooms, or saunas.  Wear your seat belt when driving.  Avoid raw meat, uncooked cheese, and liter boxes and soil used by cats.  Take your prenatal vitamins.  Take 1500-2000 milligrams of calcium daily starting at the 20th week of pregnancy until you deliver your baby.  Try taking medicine that helps you poop (stool softener) as needed, and if your doctor approves. Eat more fiber by eating fresh fruit, vegetables, and whole grains. Drink enough fluids to keep your pee (urine) clear or pale yellow.  Take warm water baths (sitz baths) to soothe pain or discomfort caused by hemorrhoids. Use hemorrhoid cream if your doctor approves.  If you have puffy, bulging veins (varicose veins), wear support hose. Raise (elevate) your feet for 15 minutes, 3-4 times a day. Limit salt in your diet.  Avoid heavy lifting, wear low heals, and sit up straight.  Rest  with your legs raised if you have leg cramps or low back pain.  Visit your dentist if you have not gone during your pregnancy. Use a soft toothbrush to brush your teeth. Be gentle when you floss.  You can have sex (intercourse) unless your doctor tells you not to.  Go to your doctor visits. Get help if:  You feel dizzy.  You have mild cramps or pressure in your lower belly (abdomen).  You have a nagging pain in your belly area.  You continue to feel sick to your stomach (nauseous), throw up (vomit), or have watery poop (diarrhea).  You have bad smelling fluid coming from your vagina.  You have pain with peeing (urination). Get help right away if:  You have a fever.  You are leaking fluid from your vagina.  You have spotting or bleeding from your vagina.  You have severe belly  cramping or pain.  You lose or gain weight rapidly.  You have trouble catching your breath and have chest pain.  You notice sudden or extreme puffiness (swelling) of your face, hands, ankles, feet, or legs.  You have not felt the baby move in over an hour.  You have severe headaches that do not go away with medicine.  You have vision changes. This information is not intended to replace advice given to you by your health care provider. Make sure you discuss any questions you have with your health care provider. Document Released: 03/14/2009 Document Revised: 05/26/2015 Document Reviewed: 02/19/2012 Elsevier Interactive Patient Education  2017 [employer].

## 2017-09-12 NOTE — Progress Notes (Deleted)
pr

## 2017-09-12 NOTE — Progress Notes (Signed)
  Subjective:    Gina Welch is being seen today for her first obstetrical visit.  This is a planned pregnancy. She is at 6924w5d gestation. Her obstetrical history is significant for advanced maternal age. Relationship with FOB (Ronte): spouse, living together. Patient does intend to breast feed. Pregnancy history fully reviewed.  Patient reports backache and lower abdominal pressure while wearing heavy bullet proof vest at work.  Review of Systems:   Review of Systems  Constitutional: Negative.   HENT: Negative.   Eyes: Negative.   Respiratory: Negative.   Cardiovascular: Negative.   Gastrointestinal: Negative.   Endocrine: Negative.   Genitourinary: Positive for pelvic pain.  Musculoskeletal: Positive for back pain.  Skin: Negative.   Allergic/Immunologic: Negative.   Neurological: Negative.   Psychiatric/Behavioral: Negative.     Objective:     BP 125/70   Pulse 94   Wt 175 lb (79.4 kg)   LMP 06/15/2017 (Approximate)   BMI 31.00 kg/m  Physical Exam  Nursing note and vitals reviewed. Constitutional: She is oriented to person, place, and time. She appears well-developed and well-nourished.  HENT:  Head: Normocephalic and atraumatic.  Right Ear: External ear normal.  Left Ear: External ear normal.  Nose: Nose normal.  Mouth/Throat: Oropharynx is clear and moist.  Eyes: Pupils are equal, round, and reactive to light. Conjunctivae and EOM are normal.  Neck: Normal range of motion. Neck supple.  Cardiovascular: Normal rate, regular rhythm, normal heart sounds and intact distal pulses.  Respiratory: Effort normal and breath sounds normal.  GI: Soft. Bowel sounds are normal.  Genitourinary:  Genitourinary Comments: Uterus: gravid, S=D, SE: cervix is smooth, pink, no lesions, small amt of white vaginal d/c -- Pap, GC/CT done, closed/long/firm, no CMT or friability, no adnexal tenderness   Musculoskeletal: Normal range of motion.  Neurological: She is alert and oriented  to person, place, and time. She has normal reflexes.  Skin: Skin is warm and dry.  Psychiatric: She has a normal mood and affect. Her behavior is normal. Judgment and thought content normal.    Maternal Exam:  Abdomen: Patient reports no abdominal tenderness. Fundal height is S=D.    Introitus: Normal vulva. Normal vagina.  Pelvis: adequate for delivery.   Pelvis proven to "almost 8 lbs" Cervix: Cervix evaluated by sterile speculum exam and digital exam.     Fetal Exam Fetal Monitor Review: Baseline rate: 164 bpm.         Assessment:    Pregnancy: [id number] Patient Active Problem List   Diagnosis Date Noted  . Supervision of other normal pregnancy, antepartum 08/27/2017       Plan:     Initial labs drawn. Prenatal vitamins. Rx for Flexeril 10 mg BID prn back pain sent. Problem list reviewed and updated. AFP3 discussed: undecided. Role of ultrasound in pregnancy discussed; fetal survey: requested. Amniocentesis discussed: not indicated. Follow up in 4 weeks. 50% of 40 min visit spent on counseling and coordination of care.     Raelyn MoraRolitta Telly Jawad 09/12/2017

## 2017-09-13 DIAGNOSIS — O339 Maternal care for disproportion, unspecified: Secondary | ICD-10-CM | POA: Diagnosis not present

## 2017-09-17 LAB — CYTOLOGY - PAP
Chlamydia: NEGATIVE
Diagnosis: HIGH — AB
HPV: DETECTED — AB
Neisseria Gonorrhea: NEGATIVE

## 2017-09-18 ENCOUNTER — Telehealth: Payer: Self-pay | Admitting: *Deleted

## 2017-09-18 NOTE — Telephone Encounter (Signed)
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 Provider is aware of results.  Gina PuMartin, Tamika L, RN

## 2017-09-24 ENCOUNTER — Telehealth: Payer: Self-pay | Admitting: General Practice

## 2017-09-24 NOTE — Telephone Encounter (Signed)
Left message for patient to give our office a call back in regards to appointment scheduled for 10/09/17 at 1:45pm @ [facility] for COLPO.

## 2017-10-09 ENCOUNTER — Encounter: Payer: BLUE CROSS/BLUE SHIELD | Admitting: Obstetrics and Gynecology

## 2017-10-10 ENCOUNTER — Encounter: Payer: BLUE CROSS/BLUE SHIELD | Admitting: Obstetrics and Gynecology

## 2017-10-16 ENCOUNTER — Encounter: Payer: BLUE CROSS/BLUE SHIELD | Admitting: Obstetrics and Gynecology

## 2017-10-17 ENCOUNTER — Encounter: Payer: BLUE CROSS/BLUE SHIELD | Admitting: Obstetrics and Gynecology

## 2018-01-15 ENCOUNTER — Other Ambulatory Visit: Payer: Self-pay

## 2018-01-15 ENCOUNTER — Inpatient Hospital Stay (HOSPITAL_COMMUNITY)
Admission: AD | Admit: 2018-01-15 | Discharge: 2018-01-15 | Disposition: A | Discharge status: Home / Self Care | Payer: BLUE CROSS/BLUE SHIELD | Source: Ambulatory Visit | Attending: Obstetrics and Gynecology | Admitting: Obstetrics and Gynecology

## 2018-01-15 ENCOUNTER — Encounter (HOSPITAL_COMMUNITY): Payer: Self-pay | Admitting: *Deleted

## 2018-01-15 DIAGNOSIS — Z3689 Encounter for other specified antenatal screening: Secondary | ICD-10-CM

## 2018-01-15 DIAGNOSIS — O26893 Other specified pregnancy related conditions, third trimester: Secondary | ICD-10-CM | POA: Insufficient documentation

## 2018-01-15 DIAGNOSIS — D649 Anemia, unspecified: Secondary | ICD-10-CM | POA: Insufficient documentation

## 2018-01-15 DIAGNOSIS — O99013 Anemia complicating pregnancy, third trimester: Secondary | ICD-10-CM | POA: Diagnosis not present

## 2018-01-15 DIAGNOSIS — R102 Pelvic and perineal pain: Secondary | ICD-10-CM | POA: Insufficient documentation

## 2018-01-15 DIAGNOSIS — O26899 Other specified pregnancy related conditions, unspecified trimester: Secondary | ICD-10-CM

## 2018-01-15 DIAGNOSIS — Z3A30 30 weeks gestation of pregnancy: Secondary | ICD-10-CM | POA: Insufficient documentation

## 2018-01-15 DIAGNOSIS — O43123 Velamentous insertion of umbilical cord, third trimester: Secondary | ICD-10-CM | POA: Diagnosis not present

## 2018-01-15 DIAGNOSIS — R42 Dizziness and giddiness: Secondary | ICD-10-CM | POA: Diagnosis present

## 2018-01-15 LAB — CBC
HCT: 36.4 % (ref 36.0–46.0)
Hemoglobin: 11.8 g/dL — ABNORMAL LOW (ref 12.0–15.0)
MCH: 28.4 pg (ref 26.0–34.0)
MCHC: 32.4 g/dL (ref 30.0–36.0)
MCV: 87.5 fL (ref 80.0–100.0)
Platelets: 192 10*3/uL (ref 150–400)
RBC: 4.16 MIL/uL (ref 3.87–5.11)
RDW: 13.8 % (ref 11.5–15.5)
WBC: 15.1 10*3/uL — ABNORMAL HIGH (ref 4.0–10.5)
nRBC: 0 % (ref 0.0–0.2)

## 2018-01-15 LAB — URINALYSIS, ROUTINE W REFLEX MICROSCOPIC
Bilirubin Urine: NEGATIVE
Glucose, UA: NEGATIVE mg/dL
Hgb urine dipstick: NEGATIVE
Ketones, ur: NEGATIVE mg/dL
Leukocytes, UA: NEGATIVE
Nitrite: NEGATIVE
Protein, ur: NEGATIVE mg/dL
Specific Gravity, Urine: 1.004 — ABNORMAL LOW (ref 1.005–1.030)
pH: 5 (ref 5.0–8.0)

## 2018-01-15 LAB — WET PREP, GENITAL
Clue Cells Wet Prep HPF POC: NONE SEEN
Sperm: NONE SEEN
Trich, Wet Prep: NONE SEEN
WBC, Wet Prep HPF POC: NONE SEEN
Yeast Wet Prep HPF POC: NONE SEEN

## 2018-01-15 NOTE — MAU Note (Signed)
Light headed, lower abd pains, sharp pains.  Feels weak,  Has a headache.  Started earlier in the week, was trying to wait for appt next wk.  Now is feeling just so weak and drained. Seeing spots.  Called dr, was told to come in.  Took some tylenol last night, didn't really help

## 2018-01-15 NOTE — MAU Provider Note (Addendum)
History     CSN: [id number]  Arrival date and time: 01/15/18 1730   First Provider Initiated Contact with Patient 01/15/18 1819      Chief Complaint  Patient presents with  . Headache  . Dizziness  . Fatigue  . Abdominal Pain  . visual changes   G3P2002 @30 .4 wks presenting with dizziness, weakness, and LAP. LAP started 10 days ago. Describes as intermittent, sharp, and bilateral. Rates 9/10. Took Tylenol but didn't help. Denies urinary sx. No VB, LOF, or ctx. Dizziness and weakness started weeks ago. Felt worse today. Admit to only 2 bottles of water today. Sitting down improves sx. Reports being on feet a lot at work, has to wear a belt around her abdomen with her gun, mags, & baton [employer]), probably weighs 20 lbs. Eating well, includes snacks between meals. Reports hx of anemia. Feeling good FM.    OB History    Gravida  3   Para  2   Term  2   Preterm      AB      Living  2     SAB      TAB      Ectopic      Multiple      Live Births  2           Past Medical History:  Diagnosis Date  . Bronchitis   . Medical history non-contributory     Past Surgical History:  Procedure Laterality Date  . NO PAST SURGERIES      Family History  Problem Relation Age of Onset  . Hypertension Mother   . Cancer Maternal Grandmother   . Diabetes Paternal Grandfather     Social History   Tobacco Use  . Smoking status: Never Smoker  . Smokeless tobacco: Never Used  Substance Use Topics  . Alcohol use: No  . Drug use: No    Allergies: No Known Allergies  Medications Prior to Admission  Medication Sig Dispense Refill Last Dose  . cyclobenzaprine (FLEXERIL) 10 MG tablet Take 1 tablet (10 mg total) by mouth 2 (two) times daily as needed for muscle spasms. 20 tablet 2   . Elastic Bandages & Supports (COMFORT FIT MATERNITY SUPP SM) MISC 1 Units by Does not apply route daily as needed. [employer] [address] [place], [place]  [address] [phone] 1 each 0   . Prenatal MV-Min-FA-Omega-3 (PRENATAL GUMMIES/DHA & FA) 0.4-32.5 MG CHEW Chew 3 each by mouth daily. Dispense Vitafol gummies 90 tablet 12   . Prenatal Vit-Fe Fumarate-FA (MULTIVITAMIN-PRENATAL) 27-0.8 MG TABS tablet Take 1 tablet by mouth daily at 12 noon.   Taking    Review of Systems  Constitutional: Negative for chills and fever.  Gastrointestinal: Positive for abdominal pain. Negative for constipation, diarrhea, nausea and vomiting.  Genitourinary: Negative for dysuria, frequency, urgency, vaginal bleeding and vaginal discharge.  Neurological: Positive for dizziness, weakness and light-headedness. Negative for syncope.   Physical Exam   Blood pressure 123/75, pulse 79, temperature 98.2 F (36.8 C), temperature source Oral, resp. rate 18, weight 88.7 kg, last menstrual period 06/15/2017, SpO2 100 %.  Physical Exam  Nursing note and vitals reviewed. Constitutional: She is oriented to person, place, and time. She appears well-developed and well-nourished. No distress (appears comfortable).  HENT:  Head: Normocephalic and atraumatic.  Neck: Normal range of motion.  Cardiovascular: Normal rate.  Respiratory: Effort normal. No respiratory distress.  GI: Soft. She exhibits no distension and  no mass. There is abdominal tenderness in the right lower quadrant. There is no rebound and no guarding.  gravid  Genitourinary:    Genitourinary Comments: SVE closed/thick   Musculoskeletal: Normal range of motion.  Neurological: She is alert and oriented to person, place, and time.  Skin: Skin is warm and dry.  Psychiatric: She has a normal mood and affect.  EFM: 145 bpm, mod variability, + accels, no decels Toco: irritability  Results for orders placed or performed during the hospital encounter of 01/15/18 (from the past 24 hour(s))  Urinalysis, Routine w reflex microscopic     Status: Abnormal   Collection Time: 01/15/18  5:44 PM  Result Value Ref Range    Color, Urine STRAW (A) YELLOW   APPearance CLEAR CLEAR   Specific Gravity, Urine 1.004 (L) 1.005 - 1.030   pH 5.0 5.0 - 8.0   Glucose, UA NEGATIVE NEGATIVE mg/dL   Hgb urine dipstick NEGATIVE NEGATIVE   Bilirubin Urine NEGATIVE NEGATIVE   Ketones, ur NEGATIVE NEGATIVE mg/dL   Protein, ur NEGATIVE NEGATIVE mg/dL   Nitrite NEGATIVE NEGATIVE   Leukocytes, UA NEGATIVE NEGATIVE  Wet prep, genital     Status: None   Collection Time: 01/15/18  6:35 PM  Result Value Ref Range   Yeast Wet Prep HPF POC NONE SEEN NONE SEEN   Trich, Wet Prep NONE SEEN NONE SEEN   Clue Cells Wet Prep HPF POC NONE SEEN NONE SEEN   WBC, Wet Prep HPF POC NONE SEEN NONE SEEN   Sperm NONE SEEN   CBC     Status: Abnormal   Collection Time: 01/15/18  6:45 PM  Result Value Ref Range   WBC 15.1 (H) 4.0 - 10.5 K/uL   RBC 4.16 3.87 - 5.11 MIL/uL   Hemoglobin 11.8 (L) 12.0 - 15.0 g/dL   HCT 22.2 97.9 - 89.2 %   MCV 87.5 80.0 - 100.0 fL   MCH 28.4 26.0 - 34.0 pg   MCHC 32.4 30.0 - 36.0 g/dL   RDW 11.9 41.7 - 40.8 %   Platelets 192 150 - 400 K/uL   nRBC 0.0 0.0 - 0.2 %   MAU Course  Procedures  MDM Chart review: pregnancy complicated by AMA, anemia, and marginal cord insertion. Not orthostatic, no anemia. No signs of PTL. No acute abd process. Recommend increased hydration, off feet when possible, decrease amt of weight lifting (no more than 20 lbs). Comfort measures for RL pain. Stable for discharge home.  Assessment and Plan   1. [redacted] weeks gestation of pregnancy   2. NST (non-stress test) reactive   3. Pain of round ligament during pregnancy    Discharge home Follow up at [facility] as scheduled PTL precautions  Allergies as of 01/15/2018   No Known Allergies     Medication List    STOP taking these medications   multivitamin-prenatal 27-0.8 MG Tabs tablet     TAKE these medications   COMFORT FIT MATERNITY SUPP SM Misc 1 Units by Does not apply route daily as needed. [facility]  [address] [place], [place] [address] [phone]   cyclobenzaprine 10 MG tablet Commonly known as:  FLEXERIL Take 1 tablet (10 mg total) by mouth 2 (two) times daily as needed for muscle spasms.   PRENATAL GUMMIES/DHA & FA 0.4-32.5 MG Chew Chew 3 each by mouth daily. Dispense Vitafol gummies      Donette Larry, CNM 01/15/2018, 6:37 PM

## 2018-01-15 NOTE — Discharge Instructions (Signed)

## 2018-01-16 LAB — GC/CHLAMYDIA PROBE AMP (~~LOC~~) NOT AT ARMC
Chlamydia: NEGATIVE
Neisseria Gonorrhea: NEGATIVE

## 2018-03-12 ENCOUNTER — Inpatient Hospital Stay (HOSPITAL_COMMUNITY)
Admission: AD | Admit: 2018-03-12 | Discharge: 2018-03-13 | DRG: 807 | Disposition: A | Discharge status: Home / Self Care | Payer: BLUE CROSS/BLUE SHIELD | Attending: Obstetrics and Gynecology | Admitting: Obstetrics and Gynecology

## 2018-03-12 ENCOUNTER — Encounter (HOSPITAL_COMMUNITY): Payer: Self-pay | Admitting: *Deleted

## 2018-03-12 DIAGNOSIS — Z3A38 38 weeks gestation of pregnancy: Secondary | ICD-10-CM

## 2018-03-12 DIAGNOSIS — O26893 Other specified pregnancy related conditions, third trimester: Secondary | ICD-10-CM | POA: Diagnosis present

## 2018-03-12 DIAGNOSIS — Z3490 Encounter for supervision of normal pregnancy, unspecified, unspecified trimester: Secondary | ICD-10-CM

## 2018-03-12 DIAGNOSIS — Z3480 Encounter for supervision of other normal pregnancy, unspecified trimester: Secondary | ICD-10-CM

## 2018-03-12 LAB — TYPE AND SCREEN
ABO/RH(D): AB POS
Antibody Screen: NEGATIVE

## 2018-03-12 LAB — CBC
HCT: 36.8 % (ref 36.0–46.0)
Hemoglobin: 11.7 g/dL — ABNORMAL LOW (ref 12.0–15.0)
MCH: 27.7 pg (ref 26.0–34.0)
MCHC: 31.8 g/dL (ref 30.0–36.0)
MCV: 87 fL (ref 80.0–100.0)
Platelets: 171 10*3/uL (ref 150–400)
RBC: 4.23 MIL/uL (ref 3.87–5.11)
RDW: 15.1 % (ref 11.5–15.5)
WBC: 10.7 10*3/uL — ABNORMAL HIGH (ref 4.0–10.5)
nRBC: 0 % (ref 0.0–0.2)

## 2018-03-12 LAB — ABO/RH: ABO/RH(D): AB POS

## 2018-03-12 LAB — RPR: RPR Ser Ql: NONREACTIVE

## 2018-03-12 LAB — POCT FERN TEST: POCT Fern Test: POSITIVE

## 2018-03-12 MED ORDER — SOD CITRATE-CITRIC ACID 500-334 MG/5ML PO SOLN: 30.0000 mL | ORAL | Status: DC | PRN

## 2018-03-12 MED ORDER — LACTATED RINGERS IV SOLN
INTRAVENOUS | Status: DC
  Administered 2018-03-12 (×2): via INTRAVENOUS

## 2018-03-12 MED ORDER — OXYCODONE-ACETAMINOPHEN 5-325 MG PO TABS: 1.0000 | ORAL_TABLET | ORAL | Status: DC | PRN

## 2018-03-12 MED ORDER — ONDANSETRON HCL 4 MG/2ML IJ SOLN: 4.0000 mg | INTRAMUSCULAR | Status: DC | PRN

## 2018-03-12 MED ORDER — SIMETHICONE 80 MG PO CHEW: 80.0000 mg | CHEWABLE_TABLET | ORAL | Status: DC | PRN

## 2018-03-12 MED ORDER — SENNOSIDES-DOCUSATE SODIUM 8.6-50 MG PO TABS
2.0000 | ORAL_TABLET | ORAL | Status: DC
  Administered 2018-03-13: 2 via ORAL
  Filled 2018-03-12: qty 2

## 2018-03-12 MED ORDER — OXYCODONE-ACETAMINOPHEN 5-325 MG PO TABS: 2.0000 | ORAL_TABLET | ORAL | Status: DC | PRN

## 2018-03-12 MED ORDER — FLEET ENEMA 7-19 GM/118ML RE ENEM: 1.0000 | ENEMA | RECTAL | Status: DC | PRN

## 2018-03-12 MED ORDER — LIDOCAINE HCL (PF) 1 % IJ SOLN
30.0000 mL | INTRAMUSCULAR | Status: DC | PRN
  Filled 2018-03-12: qty 30

## 2018-03-12 MED ORDER — BENZOCAINE-MENTHOL 20-0.5 % EX AERO: 1.0000 "application " | INHALATION_SPRAY | CUTANEOUS | Status: DC | PRN

## 2018-03-12 MED ORDER — DIBUCAINE 1 % RE OINT: 1.0000 "application " | TOPICAL_OINTMENT | RECTAL | Status: DC | PRN

## 2018-03-12 MED ORDER — OXYTOCIN 40 UNITS IN NORMAL SALINE INFUSION - SIMPLE MED
2.5000 [IU]/h | INTRAVENOUS | Status: DC
  Administered 2018-03-12: 2.5 [IU]/h via INTRAVENOUS

## 2018-03-12 MED ORDER — ACETAMINOPHEN 325 MG PO TABS
650.0000 mg | ORAL_TABLET | ORAL | Status: DC | PRN
  Administered 2018-03-12 – 2018-03-13 (×2): 650 mg via ORAL
  Filled 2018-03-12 (×2): qty 2

## 2018-03-12 MED ORDER — ONDANSETRON HCL 4 MG/2ML IJ SOLN: 4.0000 mg | Freq: Four times a day (QID) | INTRAMUSCULAR | Status: DC | PRN

## 2018-03-12 MED ORDER — TETANUS-DIPHTH-ACELL PERTUSSIS 5-2.5-18.5 LF-MCG/0.5 IM SUSP: 0.5000 mL | Freq: Once | INTRAMUSCULAR | Status: DC

## 2018-03-12 MED ORDER — ZOLPIDEM TARTRATE 5 MG PO TABS: 5.0000 mg | ORAL_TABLET | Freq: Every evening | ORAL | Status: DC | PRN

## 2018-03-12 MED ORDER — WITCH HAZEL-GLYCERIN EX PADS: 1.0000 "application " | MEDICATED_PAD | CUTANEOUS | Status: DC | PRN

## 2018-03-12 MED ORDER — LACTATED RINGERS IV SOLN: 500.0000 mL | INTRAVENOUS | Status: DC | PRN

## 2018-03-12 MED ORDER — OXYTOCIN BOLUS FROM INFUSION
500.0000 mL | Freq: Once | INTRAVENOUS | Status: AC
  Administered 2018-03-12: 500 mL via INTRAVENOUS

## 2018-03-12 MED ORDER — ONDANSETRON HCL 4 MG PO TABS: 4.0000 mg | ORAL_TABLET | ORAL | Status: DC | PRN

## 2018-03-12 MED ORDER — IBUPROFEN 600 MG PO TABS
600.0000 mg | ORAL_TABLET | Freq: Four times a day (QID) | ORAL | Status: DC
  Administered 2018-03-12 – 2018-03-13 (×4): 600 mg via ORAL
  Filled 2018-03-12 (×3): qty 1

## 2018-03-12 MED ORDER — OXYTOCIN 40 UNITS IN NORMAL SALINE INFUSION - SIMPLE MED
1.0000 m[IU]/min | INTRAVENOUS | Status: DC
  Administered 2018-03-12: 2 m[IU]/min via INTRAVENOUS
  Filled 2018-03-12: qty 1000

## 2018-03-12 MED ORDER — FENTANYL CITRATE (PF) 100 MCG/2ML IJ SOLN
100.0000 ug | INTRAMUSCULAR | Status: DC | PRN
  Administered 2018-03-12: 100 ug via INTRAVENOUS
  Filled 2018-03-12: qty 2

## 2018-03-12 MED ORDER — COCONUT OIL OIL: 1.0000 "application " | TOPICAL_OIL | Status: DC | PRN

## 2018-03-12 MED ORDER — MEASLES, MUMPS & RUBELLA VAC IJ SOLR: 0.5000 mL | Freq: Once | INTRAMUSCULAR | Status: DC

## 2018-03-12 MED ORDER — ACETAMINOPHEN 325 MG PO TABS: 650.0000 mg | ORAL_TABLET | ORAL | Status: DC | PRN

## 2018-03-12 NOTE — MAU Note (Signed)
PT SAYS SROM AT 0415. NO VE IN OFFICE -  GBS- NEG

## 2018-03-12 NOTE — Progress Notes (Signed)
PT in bed, feeling urge to push, small crown noted when pushing.  FHR monitor adjusted several times.  Maternal position making continuous tracing difficult.  Maternal heart rate tracing with FHR audible in 140-150 BMP range.

## 2018-03-12 NOTE — H&P (Signed)
Gina Welch is an 38 y.o. [phone] [redacted]w[redacted]d black female who was admitted by Dr.Callahan last pm for SROM. PNC was complicated by AMA and a High grade abnl pap. GBS- neg/ nl NIPT/ Nl OGTT  Past Medical History:  Diagnosis Date  . Bronchitis   . Medical history non-contributory     Past Surgical History:  Procedure Laterality Date  . NO PAST SURGERIES      Family History  Problem Relation Age of Onset  . Hypertension Mother   . Cancer Maternal Grandmother   . Diabetes Paternal Grandfather    Social History:  reports that she has never smoked. She has never used smokeless tobacco. She reports that she does not drink alcohol or use drugs.  Allergies: No Known Allergies  Medications Prior to Admission  Medication Sig Dispense Refill  . Prenatal MV-Min-FA-Omega-3 (PRENATAL GUMMIES/DHA & FA) 0.4-32.5 MG CHEW Chew 3 each by mouth daily. Dispense Vitafol gummies 90 tablet 12  . cyclobenzaprine (FLEXERIL) 10 MG tablet Take 1 tablet (10 mg total) by mouth 2 (two) times daily as needed for muscle spasms. 20 tablet 2  . Elastic Bandages & Supports (COMFORT FIT MATERNITY SUPP SM) MISC 1 Units by Does not apply route daily as needed. [facility] [address] [place], [place] [address] [phone] 1 each 0       Blood pressure 127/88, pulse 72, temperature 97.9 F (36.6 C), temperature source Oral, resp. rate 17, height 5\' 4"  (1.626 m), weight 90.8 kg, last menstrual period 06/15/2017. General appearance: alert Abdomen: gravid, non tender   Lab Results  Component Value Date   WBC 10.7 (H) 03/12/2018   HGB 11.7 (L) 03/12/2018   HCT 36.8 03/12/2018   MCV 87.0 03/12/2018   PLT 171 03/12/2018   Lab Results  Component Value Date   PREGTESTUR POSITIVE (A) 07/20/2017      Patient Active Problem List   Diagnosis Date Noted  . Indication for care in labor or delivery 03/12/2018  . Supervision of other normal pregnancy, antepartum 08/27/2017   Imp/ IUP at term        SROM          AMA Plan/ Admit  ANDERSON,MARK E 03/12/2018, 9:42 AM

## 2018-03-12 NOTE — Progress Notes (Signed)
Pt continues to labor in different positions and continuous FHR tracing is difficult.  Monitor adjusted several times, patient can not tolerate lying on back.  Maternal heart rate tracing, FHR audible with MHR in 140 BPM range.

## 2018-03-12 NOTE — Progress Notes (Signed)
Pt is on hnads and knees.  Monitor adjusted to trace FHR.  Maternal heart rate tyracing, FHR audible with MHR in 140 BPM range.

## 2018-03-12 NOTE — Progress Notes (Signed)
DElivery note  Pt was admitted with SROM. She progressed along a nl labor curve.  She pushed for 10 min and had a SVD of one live viable infant over an intact perineum. Placenta- S/I. EBL-400cc. Baby to NBN. Delivered in the LOA position

## 2018-03-12 NOTE — Anesthesia Pain Management Evaluation Note (Signed)
  CRNA Pain Management Visit Note  Patient: Gina Welch, 38 y.o., female  "Hello I am a member of the anesthesia team at [facility]. We have an anesthesia team available at all times to provide care throughout the hospital, including epidural management and anesthesia for C-section. I don't know your plan for the delivery whether it a natural birth, water birth, IV sedation, nitrous supplementation, doula or epidural, but we want to meet your pain goals."   1.Was your pain managed to your expectations on prior hospitalizations?   Yes   2.What is your expectation for pain management during this hospitalization?     Epidural  3.How can we help you reach that goal?   Record the patient's initial score and the patient's pain goal.   Pain: 8  Pain Goal: 10 [facility] wants you to be able to say your pain was always managed very well.  Laban Emperor 03/12/2018

## 2018-03-13 NOTE — Progress Notes (Signed)
Patient is eating, ambulating, voiding.  Pain control is good.  Vitals:   03/12/18 1639 03/12/18 2030 03/13/18 0130 03/13/18 0501  BP: 114/73 131/65 114/64 116/73  Pulse: 67 95 96 67  Resp: 18 20 18 16   Temp: 98.7 F (37.1 C) 98.4 F (36.9 C) 98.6 F (37 C) 98.4 F (36.9 C)  TempSrc: Oral Oral Oral Oral  SpO2:  100% 97%   Weight:      Height:        Fundus firm Perineum without swelling.  Lab Results  Component Value Date   WBC 10.7 (H) 03/12/2018   HGB 11.7 (L) 03/12/2018   HCT 36.8 03/12/2018   MCV 87.0 03/12/2018   PLT 171 03/12/2018    --/--/AB POS, AB POS Performed at [facility], [address], [place], [place] [address]  (03/11 0542)/RI  A/P Post partum day 1.  Routine care.  Expect d/c routine.    Loney Laurence

## 2018-03-13 NOTE — Lactation Note (Signed)
This note was copied from a baby's chart. Lactation Consultation Note  Patient Name: Gina Welch [id number] Date: 03/13/2018 Reason for consult: Initial assessment;1st time breastfeeding;Early term 37-38.6wks P3, 14 hour female infant. Per parents, infant had 3 stools and 2 void diapers since delivery. Per mom, she has DEBP at home. Per mom, she BF her 2nd child for 4 months. Per mom, she feels breastfeeding is going well.  Per parents, she given infant 15 ml of formula prior to [facility] entering the room. Per mom, she felt she did not have any breast milk. LC ask mom to hand express and mom expressed 31ml of colostrum she will offer at next feeding. Mom plans to breastfeed first, offer EBM/ or formula afterwards based on infant's / age .  Mom will breastfeed according hunger cues, 8 or more times within 24 hours. Mom will call Nurse or LC if she has any questions,concerns or need assistance with latching infant to breast.  LC discussed I & O. Reviewed Baby & Me book's Breastfeeding Basics.  Mom made aware of O/P services, breastfeeding support groups, community resources, and our phone # for post-discharge questions.  Maternal Data Formula Feeding for Exclusion: Yes Reason for exclusion: Mother's choice to formula and breast feed on admission Has patient been taught Hand Expression?: Yes(Mom hand expressed 5 ml of colostrum for next feeding.) Does the patient have breastfeeding experience prior to this delivery?: Yes  Feeding Feeding Type: Formula  LATCH Score                   Interventions Interventions: Breast feeding basics reviewed;Hand express;Hand pump;DEBP  Lactation Tools Discussed/Used WIC Program: No   Consult Status Consult Status: Follow-up Date: 03/13/18 Follow-up type: In-patient    Danelle Earthly 03/13/2018, 6:49 AM

## 2018-03-13 NOTE — Progress Notes (Signed)
CSW received consult for hx of anxiety.  CSW met with MOB to offer support and complete assessment.    MOB holding baby in bed when CSW entered the room. CSW introduced self, role and reason for consult. MOB expressed understanding and was engaged throughout assessment. CSW inquired about MOB's mental health history to which patient stated she was diagnosed with anxiety last year but attributed it to her work environment. MOB stated she was with [employer], at that time, and was having symptoms of over-thinking and having difficulty sleeping. MOB reported she has since been moved to [place] and reported her stress levels are much lower and her symptoms are manageable. MOB denied any current mental health symptoms and denied any current SI/HI or DV in the home. MOB reported a good support system consisting of FOB, FOB's family, MOB's family and her best friend.   CSW provided education regarding the baby blues period vs. perinatal mood disorders, discussed treatment and gave resources for mental health follow up if concerns arise.  CSW recommends self-evaluation during the postpartum time period using the New Mom Checklist from Postpartum Progress and encouraged MOB to contact a medical professional if symptoms are noted at any time.    CSW provided review of Sudden Infant Death Syndrome (SIDS) precautions.    CSW identifies no further need for intervention and no barriers to discharge at this time.  Ollen Barges, [place]  [facility] [employer] [phone]

## 2018-04-01 NOTE — Discharge Summary (Signed)
Obstetric Discharge Summary Reason for Admission: rupture of membranes Prenatal Procedures: NST Intrapartum Procedures: spontaneous vaginal delivery Postpartum Procedures: none Complications-Operative and Postpartum: none Hemoglobin  Date Value Ref Range Status  03/12/2018 11.7 (L) 12.0 - 15.0 g/dL Final  88/50/2774 12.8 11.1 - 15.9 g/dL Final   HCT  Date Value Ref Range Status  03/12/2018 36.8 36.0 - 46.0 % Final   Hematocrit  Date Value Ref Range Status  08/27/2017 37.3 34.0 - 46.6 % Final     Discharge Diagnoses: Term Pregnancy-delivered  Discharge Information: Date: 04/01/2018 Activity: pelvic rest Diet: routine Medications: Ibuprofen Condition: stable Instructions: refer to practice specific booklet Discharge to: home   Newborn Data: Live born female  Birth Weight: 6 lb 14.1 oz (3121 g) APGAR: 6, 9  Newborn Delivery   Birth date/time:  03/12/2018 13:06:00 Delivery type:  Vaginal, Spontaneous     Home with mother.  Loney Laurence 04/01/2018, 7:52 AM

## 2019-05-13 DIAGNOSIS — Z20822 Contact with and (suspected) exposure to covid-19: Secondary | ICD-10-CM | POA: Diagnosis not present

## 2019-11-12 IMAGING — US US OB TRANSVAGINAL
1 series · 15 of 28 positions shown · non-contrast
Comparison: None.

CLINICAL DATA: Small amount of vaginal bleeding and right pelvic
tenderness. Approximately 5 weeks and 0 days pregnant by last
menstrual period. The patient is not sure of when the last menstrual
period started. Quantitative beta HCG [DATE].

EXAM:
OBSTETRIC <14 WK US AND TRANSVAGINAL OB US
TECHNIQUE: Both transabdominal and transvaginal ultrasound examinations were
performed for complete evaluation of the gestation as well as the
maternal uterus, adnexal regions, and pelvic cul-de-sac.
Transvaginal technique was performed to assess early pregnancy.

[Series 1: us ob transvaginal · 36 acquisitions, 15 frames shown]
[im 1/36]
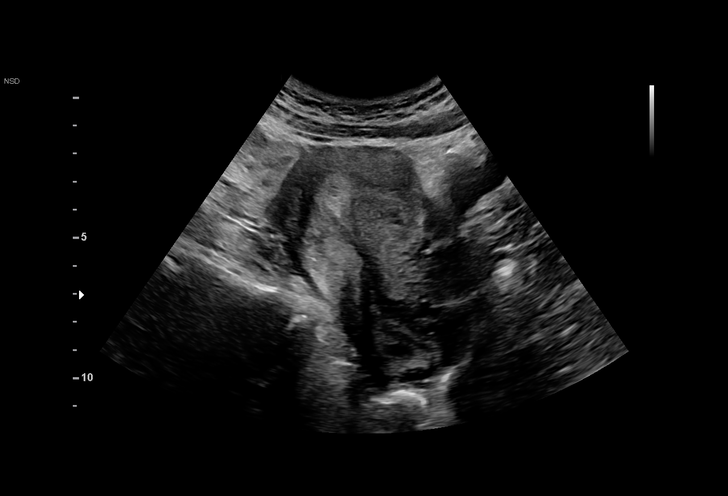
[im 3/36]
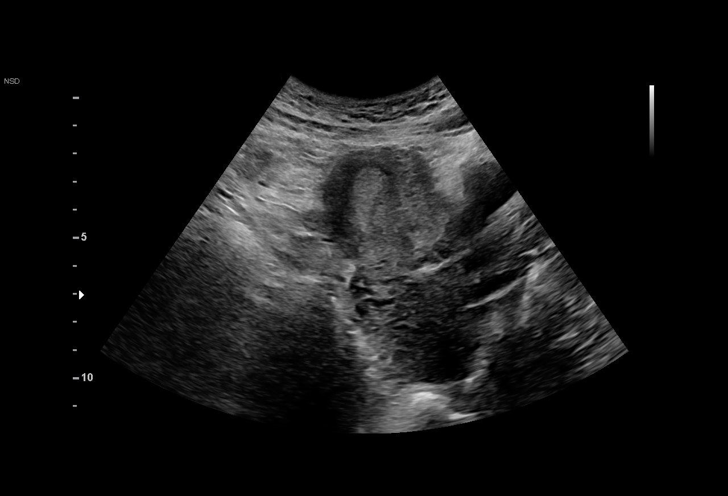
[im 6/36]
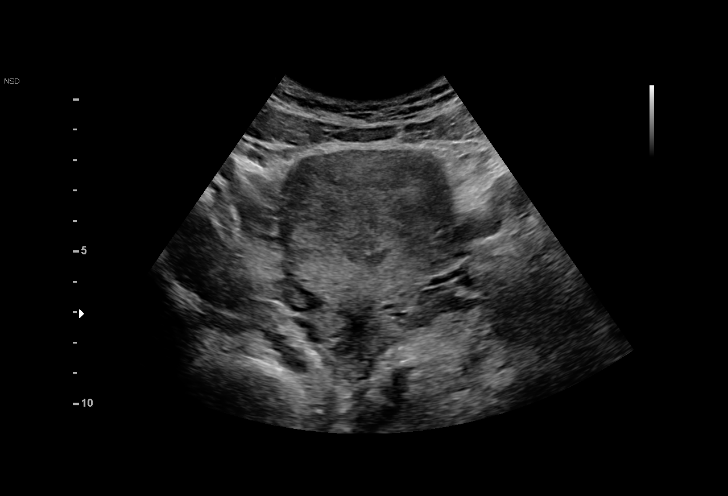
[im 8/36]
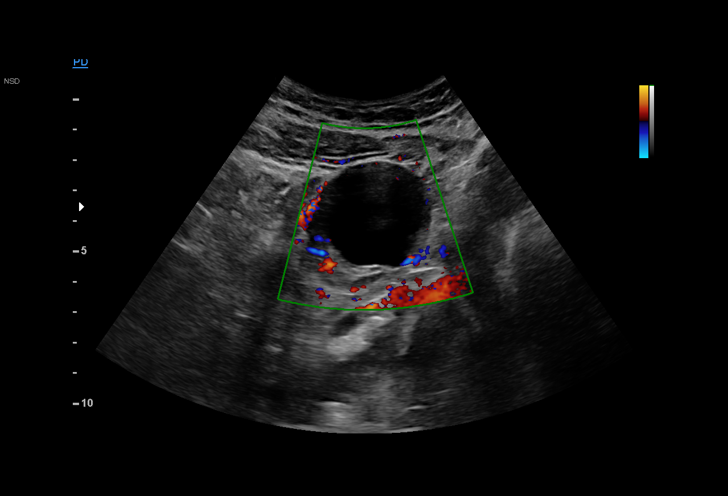
[im 11/36]
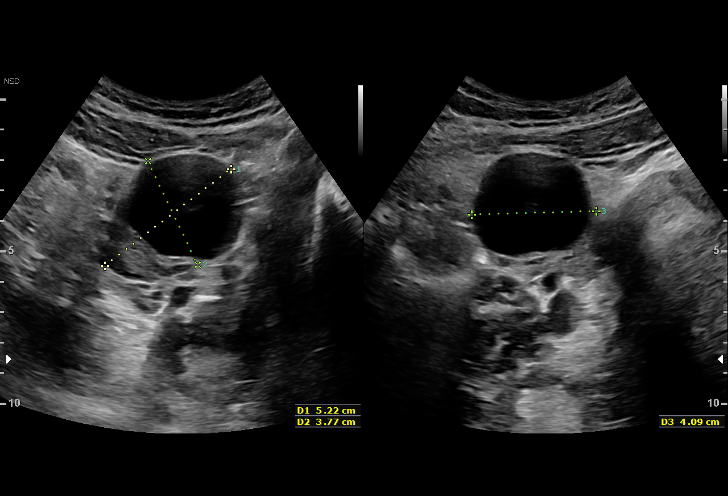
[im 13/36]
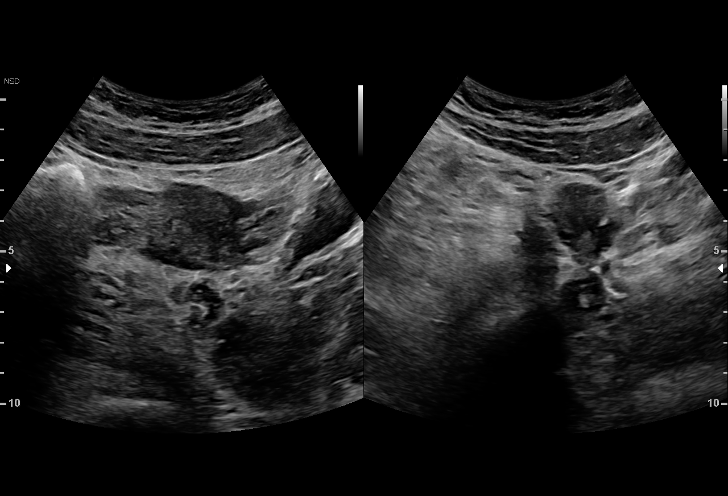
[im 16/36]
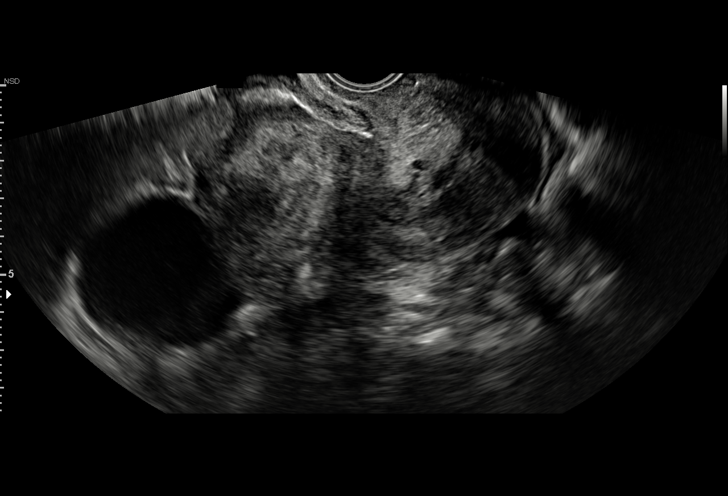
[im 19/36]
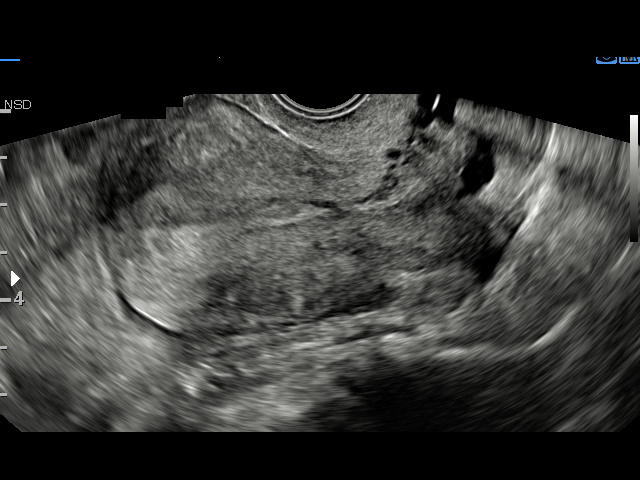
[im 20/36]
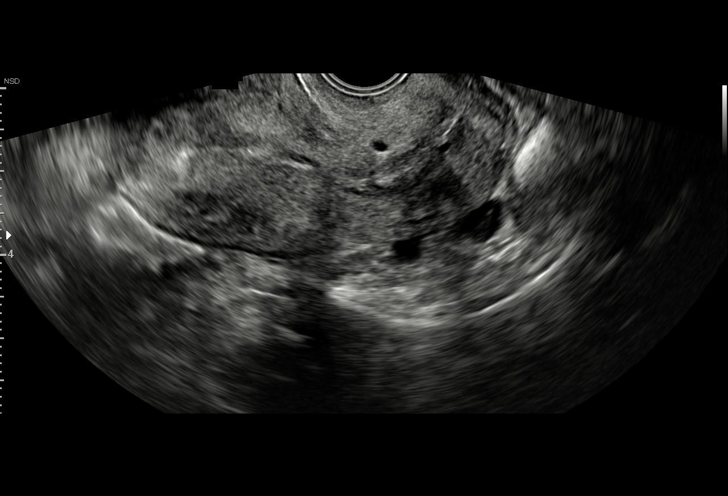
[im 23/36]
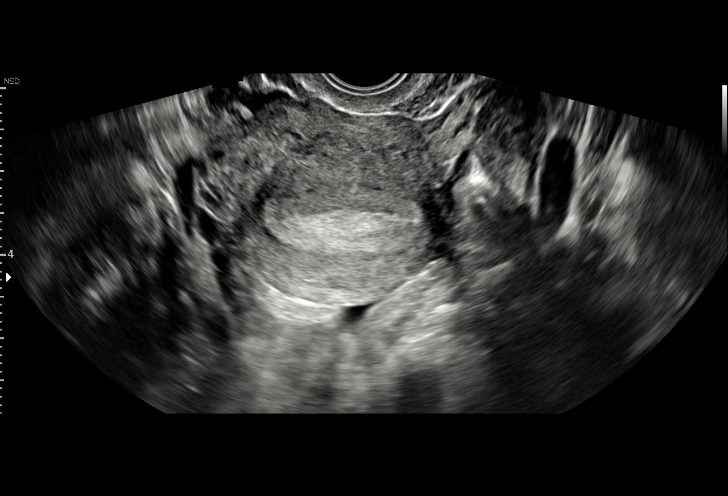
[im 25/36]
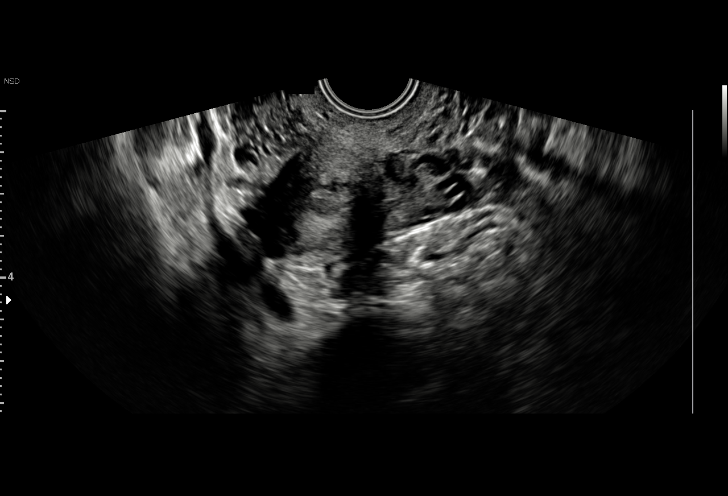
[im 28/36]
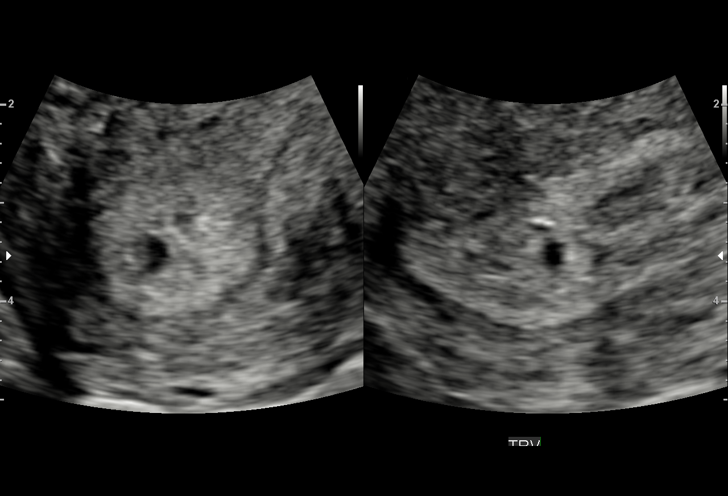
[im 30/36]
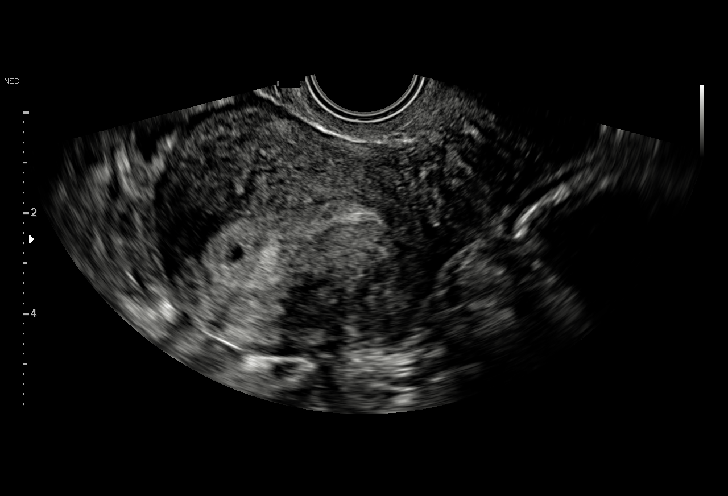
[im 33/36]
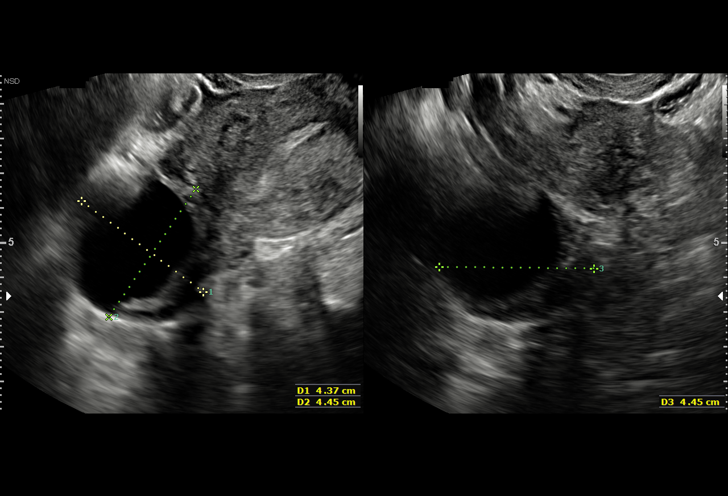
[im 36/36]
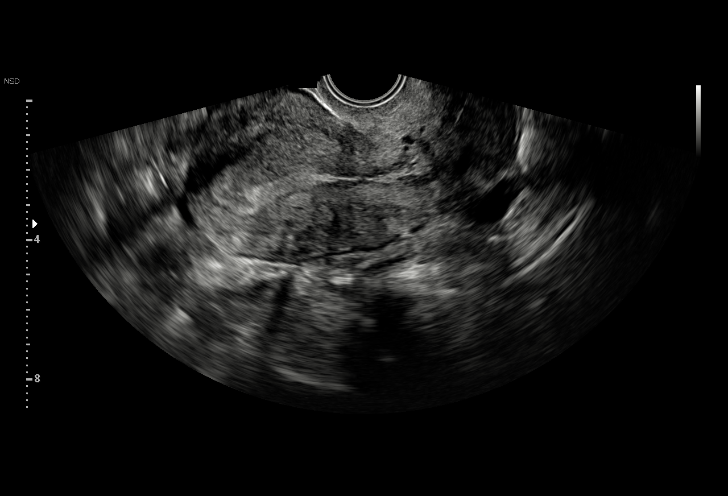

[15 of 28 positions shown; findings below may reference images not displayed]

FINDINGS: Intrauterine gestational sac: Small oval fluid collection in the
endometrial cavity with an appearance suggesting a small, early
gestational sac.

Yolk sac:  Not visualized

Embryo:  Not visualized

MSD: 2.3 mm   4 w   6 d

Subchorionic hemorrhage:  None visualized.

Maternal uterus/adnexae: 3.7 cm simple right ovarian corpus luteum
cyst. Normal appearing left ovary. Trace amount of free peritoneal
fluid.
IMPRESSION: Probable early intrauterine gestational sac, but no yolk sac, fetal
pole, or cardiac activity yet visualized. Recommend follow-up
quantitative B-HCG levels and follow-up US in 14 days to assess
viability. This recommendation follows SRU consensus guidelines:
Diagnostic Criteria for Nonviable Pregnancy Early in the First
Trimester. N Engl J Med 8687; [DATE].

## 2019-12-08 LAB — OB RESULTS CONSOLE HIV ANTIBODY (ROUTINE TESTING): HIV: NONREACTIVE

## 2019-12-08 LAB — OB RESULTS CONSOLE HEPATITIS B SURFACE ANTIGEN: Hepatitis B Surface Ag: NEGATIVE

## 2019-12-08 LAB — OB RESULTS CONSOLE GC/CHLAMYDIA
Chlamydia: NEGATIVE
Gonorrhea: NEGATIVE

## 2019-12-17 ENCOUNTER — Emergency Department (HOSPITAL_COMMUNITY): Payer: Medicaid Other

## 2019-12-17 ENCOUNTER — Emergency Department (HOSPITAL_COMMUNITY)
Admission: EM | Admit: 2019-12-17 | Discharge: 2019-12-17 | Disposition: A | Discharge status: Home / Self Care | Payer: Medicaid Other | Attending: Emergency Medicine | Admitting: Emergency Medicine

## 2019-12-17 ENCOUNTER — Other Ambulatory Visit: Payer: Self-pay

## 2019-12-17 ENCOUNTER — Encounter (HOSPITAL_COMMUNITY): Payer: Self-pay | Admitting: Obstetrics and Gynecology

## 2019-12-17 DIAGNOSIS — O219 Vomiting of pregnancy, unspecified: Secondary | ICD-10-CM | POA: Diagnosis present

## 2019-12-17 DIAGNOSIS — R202 Paresthesia of skin: Secondary | ICD-10-CM | POA: Diagnosis not present

## 2019-12-17 DIAGNOSIS — Z3A12 12 weeks gestation of pregnancy: Secondary | ICD-10-CM | POA: Insufficient documentation

## 2019-12-17 DIAGNOSIS — R200 Anesthesia of skin: Secondary | ICD-10-CM | POA: Diagnosis not present

## 2019-12-17 DIAGNOSIS — R519 Headache, unspecified: Secondary | ICD-10-CM

## 2019-12-17 LAB — URINALYSIS, ROUTINE W REFLEX MICROSCOPIC
Bilirubin Urine: NEGATIVE
Glucose, UA: NEGATIVE mg/dL
Ketones, ur: NEGATIVE mg/dL
Leukocytes,Ua: NEGATIVE
Nitrite: NEGATIVE
Protein, ur: NEGATIVE mg/dL
Specific Gravity, Urine: 1.012 (ref 1.005–1.030)
pH: 5 (ref 5.0–8.0)

## 2019-12-17 LAB — COMPREHENSIVE METABOLIC PANEL
ALT: 11 U/L (ref 0–44)
AST: 13 U/L — ABNORMAL LOW (ref 15–41)
Albumin: 3.5 g/dL (ref 3.5–5.0)
Alkaline Phosphatase: 54 U/L (ref 38–126)
Anion gap: 9 (ref 5–15)
BUN: 12 mg/dL (ref 6–20)
CO2: 22 mmol/L (ref 22–32)
Calcium: 9.3 mg/dL (ref 8.9–10.3)
Chloride: 101 mmol/L (ref 98–111)
Creatinine, Ser: 0.65 mg/dL (ref 0.44–1.00)
GFR, Estimated: >60 mL/min (ref >60)
Glucose, Bld: 93 mg/dL (ref 70–99)
Potassium: 4 mmol/L (ref 3.5–5.1)
Sodium: 132 mmol/L — ABNORMAL LOW (ref 135–145)
Total Bilirubin: 0.2 mg/dL — ABNORMAL LOW (ref 0.3–1.2)
Total Protein: 7.6 g/dL (ref 6.5–8.1)

## 2019-12-17 LAB — CBC
HCT: 39.5 % (ref 36.0–46.0)
Hemoglobin: 12.8 g/dL (ref 12.0–15.0)
MCH: 29.1 pg (ref 26.0–34.0)
MCHC: 32.4 g/dL (ref 30.0–36.0)
MCV: 89.8 fL (ref 80.0–100.0)
Platelets: 217 10*3/uL (ref 150–400)
RBC: 4.4 MIL/uL (ref 3.87–5.11)
RDW: 13.7 % (ref 11.5–15.5)
WBC: 11.8 10*3/uL — ABNORMAL HIGH (ref 4.0–10.5)
nRBC: 0 % (ref 0.0–0.2)

## 2019-12-17 LAB — LIPASE, BLOOD: Lipase: 23 U/L (ref 11–51)

## 2019-12-17 LAB — HCG, QUANTITATIVE, PREGNANCY: hCG, Beta Chain, Quant, S: 80952 m[IU]/mL — ABNORMAL HIGH (ref <5)

## 2019-12-17 MED ORDER — DIPHENHYDRAMINE HCL 50 MG/ML IJ SOLN
12.5000 mg | Freq: Once | INTRAMUSCULAR | Status: AC
  Administered 2019-12-17: 12.5 mg via INTRAVENOUS
  Filled 2019-12-17: qty 1

## 2019-12-17 MED ORDER — KETOROLAC TROMETHAMINE 30 MG/ML IJ SOLN
15.0000 mg | Freq: Once | INTRAMUSCULAR | Status: AC
  Administered 2019-12-17: 15 mg via INTRAVENOUS
  Filled 2019-12-17: qty 1

## 2019-12-17 MED ORDER — SODIUM CHLORIDE 0.9 % IV BOLUS
500.0000 mL | Freq: Once | INTRAVENOUS | Status: AC
  Administered 2019-12-17: 500 mL via INTRAVENOUS

## 2019-12-17 MED ORDER — METOCLOPRAMIDE HCL 5 MG/ML IJ SOLN
10.0000 mg | Freq: Once | INTRAMUSCULAR | Status: AC
  Administered 2019-12-17: 10 mg via INTRAVENOUS
  Filled 2019-12-17: qty 2

## 2019-12-17 NOTE — Discharge Instructions (Addendum)
There were no complications seen from the headache.  It may be a migraine headache.  Sure you are getting plenty of rest, drink a lot of fluids and taking your regular medication for the pregnancy.  Call your obstetrician to let them know that he had to come here for the headache.  They may want to see you for checkup next week.  Return here, if needed.

## 2019-12-17 NOTE — ED Triage Notes (Signed)
Patient reports to there ER for numbness and tingling down her right arm. Patient reports she is [redacted] weeks pregnant and her left arm went numb today. Patient reports she has also had nausea and emesis.

## 2019-12-17 NOTE — ED Provider Notes (Signed)
[facility]-EMERGENCY DEPT Provider Note   CSN: [id number] Arrival date & time: 12/17/19  1742     History Chief Complaint  Patient presents with  . Nausea  . Emesis During Pregnancy    Gina Welch is a 39 y.o. female.  HPI She presents for evaluation of headache associated with tingling both hands, pain in left arm and severe headache.  Onset of symptoms at 430 today and persistent.  She has ongoing nausea and vomiting associated with first trimester pregnancy.  She had prenatal care about a week ago.  She is multiparous.  No prior complications with pregnancies.  She denies blurred vision, stumbling, fall, weakness.  Only medication she is on are prenatal vitamins.  No other recent illnesses.  There are no other known modifying factors.    Past Medical History:  Diagnosis Date  . Bronchitis   . Medical history non-contributory     Patient Active Problem List   Diagnosis Date Noted  . Indication for care in labor or delivery 03/12/2018  . Pregnancy 03/12/2018  . Supervision of other normal pregnancy, antepartum 08/27/2017    Past Surgical History:  Procedure Laterality Date  . NO PAST SURGERIES       OB History    Gravida  4   Para  3   Term  3   Preterm      AB      Living  3     SAB      IAB      Ectopic      Multiple  0   Live Births  3           Family History  Problem Relation Age of Onset  . Hypertension Mother   . Cancer Maternal Grandmother   . Diabetes Paternal Grandfather     Social History   Tobacco Use  . Smoking status: Never Smoker  . Smokeless tobacco: Never Used  Vaping Use  . Vaping Use: Never used  Substance Use Topics  . Alcohol use: No  . Drug use: No    Home Medications Prior to Admission medications   Medication Sig Start Date End Date Taking? Authorizing Provider  calcium carbonate (TUMS - DOSED IN MG ELEMENTAL CALCIUM) 500 MG chewable tablet Chew 3 tablets by mouth daily as  needed for indigestion or heartburn.    [provider]  cyclobenzaprine (FLEXERIL) 10 MG tablet Take 1 tablet (10 mg total) by mouth 2 (two) times daily as needed for muscle spasms. Patient not taking: Reported on 03/12/2018 09/12/17   Raelyn Mora, CNM  Elastic Bandages & Supports (COMFORT FIT MATERNITY SUPP SM) MISC 1 Units by Does not apply route daily as needed. [facility] [address] [place], [place] [address] [phone] 09/12/17   Raelyn Mora, CNM  Prenatal MV-Min-FA-Omega-3 (PRENATAL GUMMIES/DHA & FA) 0.4-32.5 MG CHEW Chew 3 each by mouth daily. Dispense Vitafol gummies 09/12/17   Raelyn Mora, CNM    Allergies    Patient has no known allergies.  Review of Systems   Review of Systems  All other systems reviewed and are negative.   Physical Exam Updated Vital Signs BP 136/81   Pulse 80   Temp 99.1 F (37.3 C) (Oral)   Resp 20   SpO2 100%   Physical Exam Vitals and nursing note reviewed.  Constitutional:      General: She is not in acute distress.    Appearance: She is well-developed and well-nourished.  She is not ill-appearing, toxic-appearing or diaphoretic.  HENT:     Head: Normocephalic and atraumatic.     Right Ear: External ear normal.     Left Ear: External ear normal.  Eyes:     Extraocular Movements: EOM normal.     Conjunctiva/sclera: Conjunctivae normal.     Pupils: Pupils are equal, round, and reactive to light.  Neck:     Trachea: Phonation normal.  Cardiovascular:     Rate and Rhythm: Normal rate and regular rhythm.     Heart sounds: Normal heart sounds.  Pulmonary:     Effort: Pulmonary effort is normal.     Breath sounds: Normal breath sounds.  Chest:     Chest wall: No bony tenderness.  Abdominal:     Palpations: Abdomen is soft.     Tenderness: There is no abdominal tenderness.  Musculoskeletal:        General: Normal range of motion.     Cervical back: Normal range of motion and neck supple.  Skin:     General: Skin is warm, dry and intact.  Neurological:     Mental Status: She is alert and oriented to person, place, and time.     Cranial Nerves: No cranial nerve deficit.     Sensory: No sensory deficit.     Motor: No abnormal muscle tone.     Coordination: Coordination normal.     Comments: No dysarthria or aphasia.  No nystagmus.  Left hand grip strength, less than right.  Normal strength  legs bilaterally.  Intact sensation.  Psychiatric:        Mood and Affect: Mood and affect and mood normal.        Behavior: Behavior normal.        Thought Content: Thought content normal.        Judgment: Judgment normal.     ED Results / Procedures / Treatments   Labs (all labs ordered are listed, but only abnormal results are displayed) Labs Reviewed  COMPREHENSIVE METABOLIC PANEL - Abnormal; Notable for the following components:      Result Value   Sodium 132 (*)    AST 13 (*)    Total Bilirubin 0.2 (*)    All other components within normal limits  CBC - Abnormal; Notable for the following components:   WBC 11.8 (*)    All other components within normal limits  URINALYSIS, ROUTINE W REFLEX MICROSCOPIC - Abnormal; Notable for the following components:   APPearance HAZY (*)    Hgb urine dipstick SMALL (*)    Bacteria, UA RARE (*)    All other components within normal limits  HCG, QUANTITATIVE, PREGNANCY - Abnormal; Notable for the following components:   hCG, Beta Chain, Quant, S 44,818 (*)    All other components within normal limits  LIPASE, BLOOD    EKG None  Radiology No results found.  Procedures Procedures (including critical care time)  Medications Ordered in ED Medications  sodium chloride 0.9 % bolus 500 mL (has no administration in time range)  ketorolac (TORADOL) 30 MG/ML injection 15 mg (has no administration in time range)  metoCLOPramide (REGLAN) injection 10 mg (has no administration in time range)  diphenhydrAMINE (BENADRYL) injection 12.5 mg (has no  administration in time range)    ED Course  I have reviewed the triage vital signs and the nursing notes.  Pertinent labs & imaging results that were available during my care of the patient were reviewed by me and  considered in my medical decision making (see chart for details).  Clinical Course as of 12/17/19 2036  Thu Dec 17, 2019  2036 Discussed with radiologist, Dr. Chase Picket regarding CT imaging of the head in a first trimester pregnancy.  He stated to proceed with shielding, so patient can be evaluated for intracranial hemorrhage. [EW]    Clinical Course User Index [EW] Mancel Bale, MD   MDM Rules/Calculators/A&P                           Patient Vitals for the past 24 hrs:  BP Temp Temp src Pulse Resp SpO2  12/17/19 2000 136/81 -- -- 80 20 100 %  12/17/19 1748 140/84 99.1 F (37.3 C) Oral 82 (!) 22 100 %    At time of discharge- reevaluation with update and discussion. After initial assessment and treatment, an updated evaluation reveals she states her headache is resolved and she has no numbness, heaviness or tingling of her extremities. Mancel Bale   Medical Decision Making:  This patient is presenting for evaluation of headache with paresthesia, which does require a range of treatment options, and is a complaint that involves a moderate risk of morbidity and mortality. The differential diagnoses include migraine headache, tension headache, CVA. I decided to review old records, and in summary Young female, first trimester pregnancy with headache, no history of migraine disorder.  I did not require additional historical information from anyone.  Clinical Laboratory Tests Ordered, included CBC, Metabolic panel, Urinalysis and Pregnancy test. Review indicates expected positive beta hCG, sodium slightly low, white count elevated Radiologic Tests Ordered, included CT head.  I independently Visualized: CT images, which show no acute abnormality    Critical  Interventions-clinical evaluation, laboratory testing, medication treatment, observation and reassessment  After These Interventions, the Patient was reevaluated and was found stable for discharge. Patient with likely migraine, somewhat complicated, improved with migraine cocktail. Patient with an apparently uncomplicated 12-week pregnancy. Patient treated symptomatically with improvement. Doubt CVA or pregnancy complication.  CRITICAL CARE-no Performed by: Mancel Bale  Nursing Notes Reviewed/ Care Coordinated Applicable Imaging Reviewed Interpretation of Laboratory Data incorporated into ED treatment  The patient appears reasonably screened and/or stabilized for discharge and I doubt any other medical condition or other [facility] requiring further screening, evaluation, or treatment in the ED at this time prior to discharge.  Plan: Home Medications-continue usual; Home Treatments-rest, fluids; return here if the recommended treatment, does not improve the symptoms; Recommended follow up-with OB care provider for further management and treatment     Final Clinical Impression(s) / ED Diagnoses Final diagnoses:  None    Rx / DC Orders ED Discharge Orders    None       Mancel Bale, MD 12/18/19 [phone]

## 2019-12-30 DIAGNOSIS — U071 COVID-19: Secondary | ICD-10-CM | POA: Diagnosis not present

## 2019-12-30 DIAGNOSIS — R059 Cough, unspecified: Secondary | ICD-10-CM | POA: Diagnosis not present

## 2019-12-30 DIAGNOSIS — J22 Unspecified acute lower respiratory infection: Secondary | ICD-10-CM | POA: Diagnosis not present

## 2020-01-02 NOTE — L&D Delivery Note (Signed)
Patient was C/C/0 and pushed for approx 25 minutes with epidural.    NSVD  female infant, Apgars 9/9, weight pending.   The patient had no laceration. Fundus was firm. EBL was expected amount. Placenta was delivered intact. Vagina was clear.  Delayed cord clamping done for 30-60 seconds while warming baby. Baby was vigorous and doing skin to skin with mother.  Gina Welch

## 2020-01-22 DIAGNOSIS — Z3482 Encounter for supervision of other normal pregnancy, second trimester: Secondary | ICD-10-CM | POA: Diagnosis not present

## 2020-01-22 DIAGNOSIS — R8271 Bacteriuria: Secondary | ICD-10-CM | POA: Diagnosis not present

## 2020-01-25 ENCOUNTER — Other Ambulatory Visit: Payer: Self-pay | Admitting: Obstetrics and Gynecology

## 2020-01-25 DIAGNOSIS — O09522 Supervision of elderly multigravida, second trimester: Secondary | ICD-10-CM

## 2020-02-04 ENCOUNTER — Encounter: Payer: Self-pay | Admitting: *Deleted

## 2020-02-08 ENCOUNTER — Other Ambulatory Visit: Payer: Self-pay

## 2020-02-08 ENCOUNTER — Ambulatory Visit: Payer: Medicaid Other | Attending: Obstetrics and Gynecology

## 2020-02-08 ENCOUNTER — Ambulatory Visit: Payer: Medicaid Other | Admitting: *Deleted

## 2020-02-08 ENCOUNTER — Encounter: Payer: Self-pay | Admitting: *Deleted

## 2020-02-08 VITALS — BP 111/69 | HR 79

## 2020-02-08 DIAGNOSIS — O09522 Supervision of elderly multigravida, second trimester: Secondary | ICD-10-CM | POA: Insufficient documentation

## 2020-03-02 ENCOUNTER — Inpatient Hospital Stay (HOSPITAL_COMMUNITY)
Admission: AD | Admit: 2020-03-02 | Discharge: 2020-03-02 | Disposition: A | Discharge status: Home / Self Care | Payer: Medicaid Other | Attending: Obstetrics and Gynecology | Admitting: Obstetrics and Gynecology

## 2020-03-02 ENCOUNTER — Other Ambulatory Visit: Payer: Self-pay

## 2020-03-02 ENCOUNTER — Encounter (HOSPITAL_COMMUNITY): Payer: Self-pay | Admitting: Obstetrics and Gynecology

## 2020-03-02 DIAGNOSIS — O99891 Other specified diseases and conditions complicating pregnancy: Secondary | ICD-10-CM

## 2020-03-02 DIAGNOSIS — R519 Headache, unspecified: Secondary | ICD-10-CM | POA: Insufficient documentation

## 2020-03-02 DIAGNOSIS — O219 Vomiting of pregnancy, unspecified: Secondary | ICD-10-CM | POA: Diagnosis not present

## 2020-03-02 DIAGNOSIS — Z3A22 22 weeks gestation of pregnancy: Secondary | ICD-10-CM | POA: Diagnosis not present

## 2020-03-02 DIAGNOSIS — O26892 Other specified pregnancy related conditions, second trimester: Secondary | ICD-10-CM | POA: Insufficient documentation

## 2020-03-02 DIAGNOSIS — R110 Nausea: Secondary | ICD-10-CM | POA: Insufficient documentation

## 2020-03-02 DIAGNOSIS — M549 Dorsalgia, unspecified: Secondary | ICD-10-CM | POA: Diagnosis not present

## 2020-03-02 DIAGNOSIS — G4489 Other headache syndrome: Secondary | ICD-10-CM

## 2020-03-02 LAB — URINALYSIS, ROUTINE W REFLEX MICROSCOPIC
Bilirubin Urine: NEGATIVE
Glucose, UA: NEGATIVE mg/dL
Ketones, ur: 20 mg/dL — AB
Leukocytes,Ua: NEGATIVE
Nitrite: NEGATIVE
Protein, ur: NEGATIVE mg/dL
Specific Gravity, Urine: 1.026 (ref 1.005–1.030)
pH: 6 (ref 5.0–8.0)

## 2020-03-02 MED ORDER — CYCLOBENZAPRINE HCL 10 MG PO TABS: 10.0000 mg | ORAL_TABLET | Freq: Two times a day (BID) | ORAL | 0 refills | Status: DC | PRN

## 2020-03-02 MED ORDER — ONDANSETRON 4 MG PO TBDP
8.0000 mg | ORAL_TABLET | Freq: Once | ORAL | Status: AC
  Administered 2020-03-02: 8 mg via ORAL
  Filled 2020-03-02: qty 2

## 2020-03-02 MED ORDER — ONDANSETRON 4 MG PO TBDP: 4.0000 mg | ORAL_TABLET | Freq: Four times a day (QID) | ORAL | 0 refills | Status: DC | PRN

## 2020-03-02 MED ORDER — ACETAMINOPHEN 325 MG PO TABS
650.0000 mg | ORAL_TABLET | Freq: Once | ORAL | Status: AC
  Administered 2020-03-02: 650 mg via ORAL
  Filled 2020-03-02: qty 2

## 2020-03-02 NOTE — MAU Provider Note (Addendum)
History     CSN: [id number]  Arrival date and time: 03/02/20 1406   Event Date/Time   First Provider Initiated Contact with Patient 03/02/20 1455      Chief Complaint  Patient presents with   Abdominal Pain   Nausea   Emesis   Back Pain   Gina Welch is a 40 y.o. G4P3003 at [redacted]w[redacted]d who receives care at [facility].  She presents today for Abdominal Pain, Nausea, and Emesis.  She reports nausea and vomiting that has been ongoing, but was usually occurring in the evening around 5 or 6pm.  She reports this was resolved with phenergan dosing.  However, she states in the past week she has been experiencing the symptoms throughout the day.  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 She reports she had an incident of vomiting today around 10 or 11am.  She states that she is able to drink water, but can not keep that down.  She states she is unable to drink soda or juice because it causes heartburn.  She endorses occasional flutters and denies vaginal concerns including leaking, bleeding, and discharge. Patient also reports some abdominal pain, but states it is intermittent and denies current concern.  She states she was told by her primary provider that this is "growing pain."    Breakfast: Sausage Leisure centre manager Fruit Cup  Lunch Chicken Salad Sandwich Grapes Peanut Crackers  Drinks water throughout the day   OB History     Gravida  4   Para  3   Term  3   Preterm      AB      Living  3      SAB      IAB      Ectopic      Multiple  0   Live Births  3           Past Medical History:  Diagnosis Date   Bronchitis    Medical history non-contributory     Past Surgical History:  Procedure Laterality Date   CERVICAL BIOPSY  W/ LOOP ELECTRODE EXCISION     NO PAST SURGERIES      Family History  Problem Relation Age of Onset    Hypertension Mother    Cancer Maternal Grandmother    Diabetes Paternal Grandfather     Social History   Tobacco Use   Smoking status: Never Smoker   Smokeless tobacco: Never Used  Vaping Use   Vaping Use: Never used  Substance Use Topics   Alcohol use: No   Drug use: No    Allergies: No Known Allergies  Medications Prior to Admission  Medication Sig Dispense Refill Last Dose   calcium carbonate (TUMS - DOSED IN MG ELEMENTAL CALCIUM) 500 MG chewable tablet Chew 3 tablets by mouth daily as needed for indigestion or heartburn.      cyclobenzaprine (FLEXERIL) 10 MG tablet Take 1 tablet (10 mg total) by mouth 2 (two) times daily as needed for muscle spasms. (Patient not taking: Reported on 02/08/2020) 20 tablet 2    Elastic Bandages & Supports (COMFORT FIT MATERNITY SUPP SM) MISC 1 Units by Does not apply route daily as needed. [employer] [address] [place], [place] [address] [phone] 1 each 0    Prenatal MV-Min-FA-Omega-3 (PRENATAL GUMMIES/DHA & FA) 0.4-32.5 MG CHEW  Chew 3 each by mouth daily. Dispense Vitafol gummies 90 tablet 12     Review of Systems  Constitutional: Negative for chills and fever.  Respiratory: Negative for cough and shortness of breath.   Gastrointestinal: Positive for abdominal pain (None currently), nausea and vomiting.  Genitourinary: Negative for difficulty urinating, dysuria, pelvic pain, vaginal bleeding and vaginal discharge.  Musculoskeletal: Positive for back pain (Lower 10/10).  Neurological: Positive for headaches (7/10-Right Temporal). Negative for dizziness and light-headedness.   Physical Exam   Blood pressure 118/73, pulse (!) 102, temperature 99.3 F (37.4 C), temperature source Oral, resp. rate 16, height 5\' 4"  (1.626 m), weight 86.6 kg, last menstrual period 09/27/2019, SpO2 99 %, unknown if currently breastfeeding.  Physical Exam Vitals and nursing note reviewed.  Constitutional:      Appearance: She is  well-developed.  HENT:     Head: Normocephalic and atraumatic.  Eyes:     Conjunctiva/sclera: Conjunctivae normal.  Cardiovascular:     Rate and Rhythm: Normal rate and regular rhythm.     Heart sounds: Normal heart sounds.  Pulmonary:     Effort: Pulmonary effort is normal. No respiratory distress.     Breath sounds: Normal breath sounds.  Abdominal:     General: Bowel sounds are normal.     Palpations: Abdomen is soft.     Tenderness: There is no abdominal tenderness.     Comments: Gravid, Appears LGA  Musculoskeletal:        General: Normal range of motion.     Cervical back: Normal range of motion.  Skin:    General: Skin is warm and dry.  Neurological:     Mental Status: She is alert and oriented to person, place, and time.  Psychiatric:        Mood and Affect: Mood normal.        Behavior: Behavior normal.        Thought Content: Thought content normal.     MAU Course  Procedures Results for orders placed or performed during the hospital encounter of 03/02/20 (from the past 24 hour(s))  Urinalysis, Routine w reflex microscopic Urine, Clean Catch     Status: Abnormal   Collection Time: 03/02/20  2:10 PM  Result Value Ref Range   Color, Urine YELLOW YELLOW   APPearance CLEAR CLEAR   Specific Gravity, Urine 1.026 1.005 - 1.030   pH 6.0 5.0 - 8.0   Glucose, UA NEGATIVE NEGATIVE mg/dL   Hgb urine dipstick SMALL (A) NEGATIVE   Bilirubin Urine NEGATIVE NEGATIVE   Ketones, ur 20 (A) NEGATIVE mg/dL   Protein, ur NEGATIVE NEGATIVE mg/dL   Nitrite NEGATIVE NEGATIVE   Leukocytes,Ua NEGATIVE NEGATIVE   RBC / HPF 6-10 0 - 5 RBC/hpf   WBC, UA 0-5 0 - 5 WBC/hpf   Bacteria, UA RARE (A) NONE SEEN   Squamous Epithelial / LPF 0-5 0 - 5   Mucus PRESENT      MDM UA AntiEmetic Analgesic Prescription Assessment and Plan  40 year old, 20  SIUP at 22.3weeks Nausea Back Pain Headache  -Reviewed POC with patient. -Exam performed.  -Discussed possibility of IV  hydration dependent upon UA results. -Due to ability to eat, discussed treatment with oral Zofran.  -Reviewed pregnancy related side effects reported with Zofran usage in first trimester as well as known side effects particularly constipation. -Patient verbalizes understanding and agreeable to treatment with zofran. -Patient declines treatment for Back Pain/Headache, but is agreeable to heating pad.  -Will await  UA results and reassess. -Zofran 8mg  ODT ordered.   03/02/2020, 2:55 PM   Reassessment (3:23 PM) -UA returns back with minimal ketones. -Discussed how this can be corrected with eating and increased hydration after nausea subsides. -Patient reports nausea improving with Zofran dosing. -Requests and given juice.  -Patient also requests medication for HA. Will give tylenol 650mg  now.  Reassessment (4:04 PM)  -Patient reports improvement in nausea with zofran dosing. -Tolerated juice without issues.  -Agreeable with prescription for home usage in addition to phenergan usage at bedtime as needed. -Reviewed home medications and discussed usage of flexeril for back pain. -Patient requests and given refill.  Instructed to use at bedtime as it can cause drowsiness. -Encouraged to call or return to MAU if symptoms worsen or with the onset of new symptoms. -Discharged to home in stable condition.  05/02/2020 MSN, CNM Advanced Practice Provider, [facility]  Attestation of Attending Supervision of Advanced Practice Provider (PA/CNM/NP): Evaluation and management procedures were performed by the Advanced Practice Provider under my supervision and collaboration.  I have reviewed the Advanced Practice Provider's note and chart, and I agree with the management and plan. I have also made any necessary editorial changes.   Cherre Robins, DO Attending Obstetrician & Gynecologist, [facility], [facility] 03/03/2020 10:59 AM

## 2020-03-02 NOTE — MAU Note (Signed)
Gina Welch is a 40 y.o. at [redacted]w[redacted]d here in MAU reporting: for the past few days has been having increased vomiting. Has been Rx phenergan, states she cannot take it during the day due to drowsiness. Emesis x 6 in the past 24 hours. Is currently feeling nauseated and has a slight headache. Also having lower back and abdominal pain. No bleeding or discharge.  Onset of complaint: ongoing  Pain score: headache 7/10, abdominal/back pain 10/10  Vitals:   03/02/20 1433  BP: 118/73  Pulse: (!) 102  Resp: 16  Temp: 99.3 F (37.4 C)  SpO2: 99%     FHT: 165  Lab orders placed from triage: UA

## 2020-03-02 NOTE — Discharge Instructions (Signed)
Back Pain in Pregnancy Back pain during pregnancy is common. Back pain may be caused by several factors that are related to changes during your pregnancy. Follow these instructions at home: Managing pain, stiffness, and swelling  If directed, for sudden (acute) back pain, put ice on the painful area. ? Put ice in a plastic bag. ? Place a towel between your skin and the bag. ? Leave the ice on for 20 minutes, 2-3 times per day.  If directed, apply heat to the affected area before you exercise. Use the heat source that your health care provider recommends, such as a moist heat pack or a heating pad. ? Place a towel between your skin and the heat source. ? Leave the heat on for 20-30 minutes. ? Remove the heat if your skin turns bright red. This is especially important if you are unable to feel pain, heat, or cold. You may have a greater risk of getting burned.  If directed, massage the affected area.      Activity  Exercise as told by your health care provider. Gentle exercise is the best way to prevent or manage back pain.  Listen to your body when lifting. If lifting hurts, ask for help or bend your knees. This uses your leg muscles instead of your back muscles.  Squat down when picking up something from the floor. Do not bend over.  Only use bed rest for short periods as told by your health care provider. Bed rest should only be used for the most severe episodes of back pain. Standing, sitting, and lying down  Do not stand in one place for long periods of time.  Use good posture when sitting. Make sure your head rests over your shoulders and is not hanging forward. Use a pillow on your lower back if necessary.  Try sleeping on your side, preferably the left side, with a pregnancy support pillow or 1-2 regular pillows between your legs. ? If you have back pain after a night's rest, your bed may be too soft. ? A firm mattress may provide more support for your back during  pregnancy. General instructions  Do not wear high heels.  Eat a healthy diet. Try to gain weight within your health care provider's recommendations.  Use a maternity girdle, elastic sling, or back brace as told by your health care provider.  Take over-the-counter and prescription medicines only as told by your health care provider.  Work with a physical therapist or massage therapist to find ways to manage back pain. Acupuncture or massage therapy may be helpful.  Keep all follow-up visits as told by your health care provider. This is important. Contact a health care provider if:  Your back pain interferes with your daily activities.  You have increasing pain in other parts of your body. Get help right away if:  You develop numbness, tingling, weakness, or problems with the use of your arms or legs.  You develop severe back pain that is not controlled with medicine.  You have a change in bowel or bladder control.  You develop shortness of breath, dizziness, or you faint.  You develop nausea, vomiting, or sweating.  You have back pain that is a rhythmic, cramping pain similar to labor pains. Labor pain is usually 1-2 minutes apart, lasts for about 1 minute, and involves a bearing down feeling or pressure in your pelvis.  You have back pain and your water breaks or you have vaginal bleeding.  You have back pain or   numbness that travels down your leg.  Your back pain developed after you fell.  You develop pain on one side of your back.  You see blood in your urine.  You develop skin blisters in the area of your back pain. Summary  Back pain may be caused by several factors that are related to changes during your pregnancy.  Follow instructions as told by your health care provider for managing pain, stiffness, and swelling.  Exercise as told by your health care provider. Gentle exercise is the best way to prevent or manage back pain.  Take over-the-counter and  prescription medicines only as told by your health care provider.  Keep all follow-up visits as told by your health care provider. This is important. This information is not intended to replace advice given to you by your health care provider. Make sure you discuss any questions you have with your health care provider. Document Revised: 04/08/2018 Document Reviewed: 06/05/2017 Elsevier Patient Education  2021 Elsevier Inc. Morning Sickness  Morning sickness is when you feel like you may vomit (feel nauseous) during pregnancy. Sometimes, you may vomit. Morning sickness most often happens in the morning, but it can also happen at any time of the day. Some women may have morning sickness that makes them vomit all the time. This is a more serious problem that needs treatment. What are the causes? The cause of this condition is not known. What increases the risk?  You had vomiting or a feeling like you may vomit before your pregnancy.  You had morning sickness in another pregnancy.  You are pregnant with more than one baby, such as twins. What are the signs or symptoms?  Feeling like you may vomit.  Vomiting. How is this treated? Treatment is usually not needed for this condition. You may only need to change what you eat. In some cases, your doctor may give you some things to take for your condition. These include:  Vitamin B6 supplements.  Medicines to treat the feeling that you may vomit.  Ginger. Follow these instructions at home: Medicines  Take over-the-counter and prescription medicines only as told by your doctor. Do not take any medicines until you talk with your doctor about them first.  Take multivitamins before you get pregnant. These can stop or lessen the symptoms of morning sickness. Eating and drinking  Eat dry toast or crackers before getting out of bed.  Eat 5 or 6 small meals a day.  Eat dry and bland foods like rice and baked potatoes.  Do not eat greasy,  fatty, or spicy foods.  Have someone cook for you if the smell of food causes you to vomit or to feel like you may vomit.  If you feel like you may vomit after taking prenatal vitamins, take them at night or with a snack.  Eat protein foods when you need a snack. Nuts, yogurt, and cheese are good choices.  Drink fluids throughout the day.  Try ginger ale made with real ginger, ginger tea made from fresh grated ginger, or ginger candies. General instructions  Do not smoke or use any products that contain nicotine or tobacco. If you need help quitting, ask your doctor.  Use an air purifier to keep the air in your house free of smells.  Get lots of fresh air.  Try to avoid smells that make you feel sick.  Try wearing an acupressure wristband. This is a wristband that is used to treat seasickness.  Try a treatment called  acupuncture. In this treatment, a doctor puts needles into certain areas of your body to make you feel better. Contact a doctor if:  You need medicine to feel better.  You feel dizzy or light-headed.  You are losing weight. Get help right away if:  The feeling that you may vomit will not go away, or you cannot stop vomiting.  You faint.  You have very bad pain in your belly. Summary  Morning sickness is when you feel like you may vomit (feel nauseous) during pregnancy.  You may feel sick in the morning, but you can feel this way at any time of the day.  Making some changes to what you eat may help your symptoms go away. This information is not intended to replace advice given to you by your health care provider. Make sure you discuss any questions you have with your health care provider. Document Revised: 08/03/2019 Document Reviewed: 07/13/2019 Elsevier Patient Education  2021 [employer].

## 2020-03-30 DIAGNOSIS — Z3480 Encounter for supervision of other normal pregnancy, unspecified trimester: Secondary | ICD-10-CM | POA: Diagnosis not present

## 2020-05-02 DIAGNOSIS — Z23 Encounter for immunization: Secondary | ICD-10-CM | POA: Diagnosis not present

## 2020-05-02 DIAGNOSIS — O09523 Supervision of elderly multigravida, third trimester: Secondary | ICD-10-CM | POA: Diagnosis not present

## 2020-06-08 DIAGNOSIS — O210 Mild hyperemesis gravidarum: Secondary | ICD-10-CM | POA: Diagnosis not present

## 2020-06-08 DIAGNOSIS — O09523 Supervision of elderly multigravida, third trimester: Secondary | ICD-10-CM | POA: Diagnosis not present

## 2020-06-08 DIAGNOSIS — Z369 Encounter for antenatal screening, unspecified: Secondary | ICD-10-CM | POA: Diagnosis not present

## 2020-06-08 DIAGNOSIS — Z3A36 36 weeks gestation of pregnancy: Secondary | ICD-10-CM | POA: Diagnosis not present

## 2020-06-08 DIAGNOSIS — Z3480 Encounter for supervision of other normal pregnancy, unspecified trimester: Secondary | ICD-10-CM | POA: Diagnosis not present

## 2020-06-10 LAB — OB RESULTS CONSOLE GBS: GBS: NEGATIVE

## 2020-06-16 DIAGNOSIS — O26843 Uterine size-date discrepancy, third trimester: Secondary | ICD-10-CM | POA: Diagnosis not present

## 2020-06-16 DIAGNOSIS — Z3A37 37 weeks gestation of pregnancy: Secondary | ICD-10-CM | POA: Diagnosis not present

## 2020-06-16 DIAGNOSIS — O09523 Supervision of elderly multigravida, third trimester: Secondary | ICD-10-CM | POA: Diagnosis not present

## 2020-06-18 ENCOUNTER — Encounter (HOSPITAL_COMMUNITY): Payer: Self-pay | Admitting: Obstetrics and Gynecology

## 2020-06-18 ENCOUNTER — Inpatient Hospital Stay (HOSPITAL_COMMUNITY): Payer: Medicaid Other | Admitting: Anesthesiology

## 2020-06-18 ENCOUNTER — Other Ambulatory Visit: Payer: Self-pay

## 2020-06-18 ENCOUNTER — Inpatient Hospital Stay (HOSPITAL_COMMUNITY)
Admission: AD | Admit: 2020-06-18 | Discharge: 2020-06-20 | DRG: 806 | Disposition: A | Discharge status: Home / Self Care | Payer: Medicaid Other | Attending: Obstetrics and Gynecology | Admitting: Obstetrics and Gynecology

## 2020-06-18 ENCOUNTER — Inpatient Hospital Stay (EMERGENCY_DEPARTMENT_HOSPITAL)
Admission: AD | Admit: 2020-06-18 | Discharge: 2020-06-18 | Disposition: A | Discharge status: Home / Self Care | Payer: Medicaid Other | Source: Home / Self Care | Attending: Obstetrics and Gynecology | Admitting: Obstetrics and Gynecology

## 2020-06-18 DIAGNOSIS — O479 False labor, unspecified: Secondary | ICD-10-CM | POA: Diagnosis not present

## 2020-06-18 DIAGNOSIS — Z3A38 38 weeks gestation of pregnancy: Secondary | ICD-10-CM | POA: Diagnosis not present

## 2020-06-18 DIAGNOSIS — D62 Acute posthemorrhagic anemia: Secondary | ICD-10-CM | POA: Diagnosis not present

## 2020-06-18 DIAGNOSIS — Z3689 Encounter for other specified antenatal screening: Secondary | ICD-10-CM | POA: Diagnosis not present

## 2020-06-18 DIAGNOSIS — O9081 Anemia of the puerperium: Secondary | ICD-10-CM | POA: Diagnosis not present

## 2020-06-18 DIAGNOSIS — O4290 Premature rupture of membranes, unspecified as to length of time between rupture and onset of labor, unspecified weeks of gestation: Secondary | ICD-10-CM

## 2020-06-18 DIAGNOSIS — Z20822 Contact with and (suspected) exposure to covid-19: Secondary | ICD-10-CM | POA: Diagnosis not present

## 2020-06-18 DIAGNOSIS — O26893 Other specified pregnancy related conditions, third trimester: Secondary | ICD-10-CM | POA: Diagnosis not present

## 2020-06-18 DIAGNOSIS — O471 False labor at or after 37 completed weeks of gestation: Secondary | ICD-10-CM | POA: Insufficient documentation

## 2020-06-18 DIAGNOSIS — O42913 Preterm premature rupture of membranes, unspecified as to length of time between rupture and onset of labor, third trimester: Secondary | ICD-10-CM

## 2020-06-18 DIAGNOSIS — Z3A37 37 weeks gestation of pregnancy: Secondary | ICD-10-CM | POA: Diagnosis not present

## 2020-06-18 LAB — RESP PANEL BY RT-PCR (FLU A&B, COVID) ARPGX2
Influenza A by PCR: NEGATIVE
Influenza B by PCR: NEGATIVE
SARS Coronavirus 2 by RT PCR: NEGATIVE

## 2020-06-18 LAB — CBC
HCT: 37.8 % (ref 36.0–46.0)
Hemoglobin: 11.9 g/dL — ABNORMAL LOW (ref 12.0–15.0)
MCH: 27.3 pg (ref 26.0–34.0)
MCHC: 31.5 g/dL (ref 30.0–36.0)
MCV: 86.7 fL (ref 80.0–100.0)
Platelets: 187 10*3/uL (ref 150–400)
RBC: 4.36 MIL/uL (ref 3.87–5.11)
RDW: 14.7 % (ref 11.5–15.5)
WBC: 11.5 10*3/uL — ABNORMAL HIGH (ref 4.0–10.5)
nRBC: 0 % (ref 0.0–0.2)

## 2020-06-18 LAB — TYPE AND SCREEN
ABO/RH(D): AB POS
Antibody Screen: NEGATIVE

## 2020-06-18 MED ORDER — FLEET ENEMA 7-19 GM/118ML RE ENEM: 1.0000 | ENEMA | RECTAL | Status: DC | PRN

## 2020-06-18 MED ORDER — OXYCODONE-ACETAMINOPHEN 5-325 MG PO TABS
2.0000 | ORAL_TABLET | ORAL | Status: DC | PRN
Start: 2020-06-18 — End: 2020-06-19

## 2020-06-18 MED ORDER — FENTANYL CITRATE (PF) 100 MCG/2ML IJ SOLN
100.0000 ug | Freq: Once | INTRAMUSCULAR | Status: AC
  Administered 2020-06-18: 100 ug via INTRAVENOUS
  Filled 2020-06-18: qty 2

## 2020-06-18 MED ORDER — LACTATED RINGERS IV SOLN
500.0000 mL | INTRAVENOUS | Status: DC | PRN
Start: 2020-06-18 — End: 2020-06-19

## 2020-06-18 MED ORDER — PHENYLEPHRINE 40 MCG/ML (10ML) SYRINGE FOR IV PUSH (FOR BLOOD PRESSURE SUPPORT): 80.0000 ug | PREFILLED_SYRINGE | INTRAVENOUS | Status: DC | PRN

## 2020-06-18 MED ORDER — DIPHENHYDRAMINE HCL 50 MG/ML IJ SOLN: 12.5000 mg | INTRAMUSCULAR | Status: DC | PRN

## 2020-06-18 MED ORDER — ACETAMINOPHEN 325 MG PO TABS: 650.0000 mg | ORAL_TABLET | ORAL | Status: DC | PRN

## 2020-06-18 MED ORDER — EPHEDRINE 5 MG/ML INJ: 10.0000 mg | INTRAVENOUS | Status: DC | PRN

## 2020-06-18 MED ORDER — OXYTOCIN-SODIUM CHLORIDE 30-0.9 UT/500ML-% IV SOLN
2.5000 [IU]/h | INTRAVENOUS | Status: DC
  Administered 2020-06-19: 07:00:00 2.5 [IU]/h via INTRAVENOUS
  Filled 2020-06-18: qty 500

## 2020-06-18 MED ORDER — OXYCODONE-ACETAMINOPHEN 5-325 MG PO TABS: 1.0000 | ORAL_TABLET | ORAL | Status: DC | PRN

## 2020-06-18 MED ORDER — LIDOCAINE HCL (PF) 1 % IJ SOLN: 30.0000 mL | INTRAMUSCULAR | Status: DC | PRN

## 2020-06-18 MED ORDER — LIDOCAINE HCL (PF) 1 % IJ SOLN
INTRAMUSCULAR | Status: DC | PRN
  Administered 2020-06-18: 7 mL via EPIDURAL
  Administered 2020-06-18: 3 mL via EPIDURAL

## 2020-06-18 MED ORDER — FENTANYL-BUPIVACAINE-NACL 0.5-0.125-0.9 MG/250ML-% EP SOLN
12.0000 mL/h | EPIDURAL | Status: DC | PRN
  Administered 2020-06-18: 12 mL/h via EPIDURAL

## 2020-06-18 MED ORDER — LACTATED RINGERS IV SOLN: 500.0000 mL | Freq: Once | INTRAVENOUS | Status: DC

## 2020-06-18 MED ORDER — SOD CITRATE-CITRIC ACID 500-334 MG/5ML PO SOLN: 30.0000 mL | ORAL | Status: DC | PRN

## 2020-06-18 MED ORDER — ONDANSETRON HCL 4 MG/2ML IJ SOLN
4.0000 mg | Freq: Four times a day (QID) | INTRAMUSCULAR | Status: DC | PRN
  Administered 2020-06-19: 4 mg via INTRAVENOUS
  Filled 2020-06-18: qty 2

## 2020-06-18 MED ORDER — OXYTOCIN BOLUS FROM INFUSION
333.0000 mL | Freq: Once | INTRAVENOUS | Status: AC
  Administered 2020-06-19: 06:00:00 333 mL via INTRAVENOUS

## 2020-06-18 MED ORDER — LACTATED RINGERS IV SOLN: INTRAVENOUS | Status: DC

## 2020-06-18 MED ORDER — FENTANYL-BUPIVACAINE-NACL 0.5-0.125-0.9 MG/250ML-% EP SOLN
EPIDURAL | Status: AC
  Filled 2020-06-18: qty 250

## 2020-06-18 NOTE — MAU Provider Note (Signed)
Event Date/Time   First Provider Initiated Contact with Patient 06/18/20 2009     S: [name] is a 40 y.o. [phone] at [redacted]w[redacted]d  who presents to MAU today complaining contractions 3 minutes since 12 hours ago.  She denies vaginal bleeding. She endorses LOF. She reports normal fetal movement.    O: LMP 09/27/2019  GENERAL: Well-developed, well-nourished female in no acute distress.  HEAD: Normocephalic, atraumatic.  CHEST: Normal effort of breathing, regular heart rate ABDOMEN: Soft, nontender, gravid  Cervical exam:   Closed, babies head well applied to cervix, fetal head palpable through lower uterine segment.  Vertex Exam by Venia Carbon NP    Fetal Monitoring: Baseline: 135 bpm Variability: Moderate  Accelerations: 15x15 Decelerations: None Contractions: Q2-3 mins    A: SIUP at [redacted]w[redacted]d  Active labor- History of Leep.   P: Patient is admitted  Patient requests Iv pain medication   Hersey Maclellan, Harolyn Rutherford, NP 06/18/2020 8:33 PM

## 2020-06-18 NOTE — Anesthesia Preprocedure Evaluation (Signed)

## 2020-06-18 NOTE — MAU Provider Note (Signed)
Gina Welch is a 40 y.o. [phone] female at [redacted]w[redacted]d weeks gestation presenting to MAU with complaints of contractions and SROM. MAU provider was requested by RN to verify presentation.  REACTIVE NST - FHR: 135 bpm / moderate variability / accels present / decels absent / TOCO: regular every 2-4 mins   Patient informed that the ultrasound is considered a limited OB ultrasound and is not intended to be a complete ultrasound exam.  Patient also informed that the ultrasound is not being completed with the intent of assessing for fetal or placental anomalies or any pelvic abnormalities.  Explained that the purpose of today's ultrasound is to assess for presentation.  Baby was found to be in a cephalic presentation. Patient acknowledges the purpose of the exam and the limitations of the study.  Gina Welch, CNM  06/18/2020 8:09 PM

## 2020-06-18 NOTE — MAU Provider Note (Signed)
Per RN: [name] is a 40 y.o. [phone] at [redacted]w[redacted]d  who presents to MAU today complaining contractions q 5-6 minutes. No VB or LOF. +FM.   O: BP 129/86   Pulse 84   Temp 98.6 F (37 C) (Oral)   Resp 18   LMP 09/27/2019   SpO2 97%   Cervical exam:  Dilation: Closed Effacement (%): Thick Cervical Position: Posterior Exam by:: F. Morris, RNC  Fetal Monitoring: Baseline: 140 Variability: mod Accelerations: + Decelerations: no Contractions: 2-5   A: SIUP at [redacted]w[redacted]d  False labor Reactive NST  P: Discharge home [facility] as scheduled Labor precautions  [facility], CNM 06/18/2020 10:06 AM

## 2020-06-18 NOTE — MAU Note (Signed)
Pt presents to MAU c/o CTX every 5-6 min becoming more intense since last pm.  Pt denies LOF or vaginal bleeding.  Endorses +FM.

## 2020-06-18 NOTE — H&P (Signed)
40 y.o. [redacted]w[redacted]d  G4P3003 comes in c/o contractions starting early morning with ROM approx 7pm.  Otherwise has good fetal movement and no bleeding.  Past Medical History:  Diagnosis Date   Bronchitis    Medical history non-contributory     Past Surgical History:  Procedure Laterality Date   CERVICAL BIOPSY  W/ LOOP ELECTRODE EXCISION     NO PAST SURGERIES      OB History  Gravida Para Term Preterm AB Living  4 3 3     3   SAB IAB Ectopic Multiple Live Births        0 3    # Outcome Date GA Lbr Len/2nd Weight Sex Delivery Anes PTL Lv  4 Current           3 Term 03/12/18 [redacted]w[redacted]d 08:45 / 00:06 3121 g M Vag-Spont None  LIV  2 Term 06/06/10 [redacted]w[redacted]d   F Vag-Spont  N LIV  1 Term 07/08/07 [redacted]w[redacted]d   F Vag-Spont   LIV    Social History   Socioeconomic History   Marital status: Married    Spouse name: Not on file   Number of children: 2   Years of education: college   Highest education level: Associate degree: academic program  Occupational History   Not on file  Tobacco Use   Smoking status: Never   Smokeless tobacco: Never  Vaping Use   Vaping Use: Never used  Substance and Sexual Activity   Alcohol use: No   Drug use: No   Sexual activity: Not Currently    Birth control/protection: None  Other Topics Concern   Not on file  Social History Narrative   Not on file   Social Determinants of Health   Financial Resource Strain: Not on file  Food Insecurity: Not on file  Transportation Needs: Not on file  Physical Activity: Not on file  Stress: Not on file  Social Connections: Not on file  Intimate Partner Violence: Not on file   Patient has no known allergies.    Prenatal Transfer Tool  Maternal Diabetes: No Genetic Screening: Normal Maternal Ultrasounds/Referrals: Normal Fetal Ultrasounds or other Referrals:  None Maternal Substance Abuse:  No Significant Maternal Medications:  None Significant Maternal Lab Results: Group B Strep negative  Other PNC: uncomplicated.  EFW  @ 37.4: 8#4    Vitals:   06/18/20 2059 06/18/20 2139  BP: 132/86   Pulse: 83   Temp:  97.6 F (36.4 C)  TempSrc:  Oral    Lungs/Cor:  NAD Abdomen:  soft, gravid Ex:  no cords, erythema SVE:  0 in MAU, pending assessment FHTs:  145,  good STV, NST R; Cat 1 tracing. Toco:  q 2-3   A/P   Admitted with labor/SROM 37.6 H.o leep Epidural desired, will attempt Other routine care  GBS Neg  2140

## 2020-06-18 NOTE — Anesthesia Procedure Notes (Signed)
Epidural Patient location during procedure: OB Start time: 06/18/2020 10:07 PM End time: 06/18/2020 10:19 PM  Staffing Anesthesiologist: Lucretia Kern, MD Performed: anesthesiologist   Preanesthetic Checklist Completed: patient identified, IV checked, risks and benefits discussed, monitors and equipment checked, pre-op evaluation and timeout performed  Epidural Patient position: sitting Prep: DuraPrep Patient monitoring: heart rate, continuous pulse ox and blood pressure Approach: midline Location: L3-L4 Injection technique: LOR air  Needle:  Needle type: Tuohy  Needle gauge: 17 G Needle length: 9 cm Needle insertion depth: 7 cm Catheter type: closed end flexible Catheter size: 19 Gauge Catheter at skin depth: 12 cm Test dose: negative  Assessment Events: blood not aspirated, injection not painful, no injection resistance, no paresthesia and negative IV test  Additional Notes Reason for block:procedure for pain

## 2020-06-18 NOTE — MAU Note (Signed)
Reports SROM at 6:30 pm. Reports contractions every 5-8 minutes. Denies vaginal bleeding +FM.

## 2020-06-19 ENCOUNTER — Encounter (HOSPITAL_COMMUNITY): Payer: Self-pay | Admitting: Obstetrics and Gynecology

## 2020-06-19 DIAGNOSIS — Z3A37 37 weeks gestation of pregnancy: Secondary | ICD-10-CM | POA: Diagnosis not present

## 2020-06-19 LAB — RPR: RPR Ser Ql: NONREACTIVE

## 2020-06-19 MED ORDER — SIMETHICONE 80 MG PO CHEW: 80.0000 mg | CHEWABLE_TABLET | ORAL | Status: DC | PRN

## 2020-06-19 MED ORDER — BENZOCAINE-MENTHOL 20-0.5 % EX AERO
1.0000 "application " | INHALATION_SPRAY | CUTANEOUS | Status: DC | PRN
  Administered 2020-06-19: 1 via TOPICAL
  Filled 2020-06-19: qty 56

## 2020-06-19 MED ORDER — IBUPROFEN 600 MG PO TABS
600.0000 mg | ORAL_TABLET | Freq: Four times a day (QID) | ORAL | Status: DC
  Administered 2020-06-19 – 2020-06-20 (×4): 600 mg via ORAL
  Filled 2020-06-19 (×5): qty 1

## 2020-06-19 MED ORDER — COCONUT OIL OIL
1.0000 "application " | TOPICAL_OIL | Status: DC | PRN
  Administered 2020-06-20: 1 via TOPICAL

## 2020-06-19 MED ORDER — WITCH HAZEL-GLYCERIN EX PADS: 1.0000 "application " | MEDICATED_PAD | CUTANEOUS | Status: DC | PRN

## 2020-06-19 MED ORDER — ONDANSETRON HCL 4 MG/2ML IJ SOLN: 4.0000 mg | INTRAMUSCULAR | Status: DC | PRN

## 2020-06-19 MED ORDER — OXYCODONE HCL 5 MG PO TABS: 5.0000 mg | ORAL_TABLET | ORAL | Status: DC | PRN

## 2020-06-19 MED ORDER — DIPHENHYDRAMINE HCL 25 MG PO CAPS: 25.0000 mg | ORAL_CAPSULE | Freq: Four times a day (QID) | ORAL | Status: DC | PRN

## 2020-06-19 MED ORDER — ONDANSETRON HCL 4 MG PO TABS: 4.0000 mg | ORAL_TABLET | ORAL | Status: DC | PRN

## 2020-06-19 MED ORDER — OXYCODONE HCL 5 MG PO TABS: 10.0000 mg | ORAL_TABLET | ORAL | Status: DC | PRN

## 2020-06-19 MED ORDER — DIBUCAINE (PERIANAL) 1 % EX OINT: 1.0000 "application " | TOPICAL_OINTMENT | CUTANEOUS | Status: DC | PRN

## 2020-06-19 MED ORDER — PRENATAL MULTIVITAMIN CH
1.0000 | ORAL_TABLET | Freq: Every day | ORAL | Status: DC
  Administered 2020-06-19: 1 via ORAL
  Filled 2020-06-19 (×2): qty 1

## 2020-06-19 MED ORDER — ACETAMINOPHEN 325 MG PO TABS: 650.0000 mg | ORAL_TABLET | ORAL | Status: DC | PRN

## 2020-06-19 MED ORDER — ZOLPIDEM TARTRATE 5 MG PO TABS: 5.0000 mg | ORAL_TABLET | Freq: Every evening | ORAL | Status: DC | PRN

## 2020-06-19 MED ORDER — SENNOSIDES-DOCUSATE SODIUM 8.6-50 MG PO TABS
2.0000 | ORAL_TABLET | Freq: Every day | ORAL | Status: DC
  Filled 2020-06-19: qty 2

## 2020-06-19 MED ORDER — OXYTOCIN-SODIUM CHLORIDE 30-0.9 UT/500ML-% IV SOLN
1.0000 m[IU]/min | INTRAVENOUS | Status: DC
  Administered 2020-06-19: 2 m[IU]/min via INTRAVENOUS

## 2020-06-19 MED ORDER — TETANUS-DIPHTH-ACELL PERTUSSIS 5-2.5-18.5 LF-MCG/0.5 IM SUSY: 0.5000 mL | PREFILLED_SYRINGE | Freq: Once | INTRAMUSCULAR | Status: DC

## 2020-06-19 NOTE — Lactation Note (Signed)
This note was copied from a baby's chart. Lactation Consultation Note Baby less than hr old. Mom just getting baby on the breast when LC entered rm. LC adjusted flange to widen latch. Baby has a small mouth. Baby looks small. Mom BF her last child but stated she didn't get much help that is why she wants to see Lactation for assistance.  Mom has pendulous breast. Compressible everted short shaft nipples. Hand expression taught w/colostrum noted. Will f/u mom and baby on MBU.   Patient Name: Gina Welch [id number] Date: 06/19/2020 Reason for consult: L&D Initial assessment;Early term 37-38.6wks Age:39 hours  Maternal Data Has patient been taught Hand Expression?: Yes Does the patient have breastfeeding experience prior to this delivery?: Yes  Feeding    LATCH Score Latch: Grasps breast easily, tongue down, lips flanged, rhythmical sucking.  Audible Swallowing: A few with stimulation  Type of Nipple: Everted at rest and after stimulation  Comfort (Breast/Nipple): Soft / non-tender  Hold (Positioning): Assistance needed to correctly position infant at breast and maintain latch.  LATCH Score: 8   Lactation Tools Discussed/Used    Interventions Interventions: Breast feeding basics reviewed;Support pillows;Skin to skin;Position options;Breast massage;Expressed milk;Hand express;Breast compression  Discharge    Consult Status Consult Status: Follow-up Date: 06/19/20 Follow-up type: In-patient    Charyl Dancer 06/19/2020, 6:58 AM

## 2020-06-19 NOTE — Plan of Care (Signed)
Pt demonstrated understanding 

## 2020-06-19 NOTE — Lactation Note (Signed)
This note was copied from a baby's chart. Lactation Consultation Note  Patient Name: Gina Welch QQPYP'P Date: 06/19/2020 Reason for consult: Initial assessment;Early term 37-38.6wks Age:39 hours  Mother is a P4 , mother reports that she breastfed her last child for 6 month.she reports that she has a lot of nipple pain,but she pressed through.  Mother reports that infant has breastfed 2 times and she has not had any pain. Infant is 38 weeks. Mother and infant sleeping when I arrived to the room. Mother reports that she would like assistance with latching infant.   Infant placed in football hold.mother easily hand expresses colostrum. Infant latched on with a shallow latch. Mother unlatched infant and relatched. Again. Infant sustained latch for 30 mins. Assist with flanging infant lips for wider gape. , Observed swallows. Infant still breastfeeding when I left the room. Mother was given [facility] brochure and basic teaching done.   Mother was given a harmony hand pump with instructions to use as needed. #24 flange was a good fit.  Mother to continue to cue base feed infant and feed at least 8-12 times or more in 24 hours and advised to allow for cluster feeding infant as needed.  Mother to continue to due STS. Mother is aware of available LC services at [facility], BFSG'S, OP Dept, and phone # for questions or concerns about breastfeeding.  Mother receptive to all teaching and plan of care.    Maternal Data Has patient been taught Hand Expression?: Yes Does the patient have breastfeeding experience prior to this delivery?: Yes How long did the patient breastfeed?: 6 months  Feeding Mother's Current Feeding Choice: Breast Milk and Formula  LATCH Score Latch: Repeated attempts needed to sustain latch, nipple held in mouth throughout feeding, stimulation needed to elicit sucking reflex.  Audible Swallowing: A few with stimulation  Type of Nipple: Everted at rest and after stimulation  Comfort  (Breast/Nipple): Soft / non-tender  Hold (Positioning): Assistance needed to correctly position infant at breast and maintain latch.  LATCH Score: 7   Lactation Tools Discussed/Used Flange Size: 24 Breast pump type: Manual  Interventions Interventions: Breast feeding basics reviewed;Assisted with latch;Skin to skin;Hand express;Adjust position;Support pillows;Position options;Hand pump;Education  Discharge Pump: Manual (manual pump given with instructions)  Consult Status Consult Status: Follow-up Date: 06/19/20 Follow-up type: In-patient    Gina Welch [facility] 06/19/2020, 3:54 PM

## 2020-06-20 LAB — CBC
HCT: 31 % — ABNORMAL LOW (ref 36.0–46.0)
Hemoglobin: 9.9 g/dL — ABNORMAL LOW (ref 12.0–15.0)
MCH: 28 pg (ref 26.0–34.0)
MCHC: 31.9 g/dL (ref 30.0–36.0)
MCV: 87.8 fL (ref 80.0–100.0)
Platelets: 154 10*3/uL (ref 150–400)
RBC: 3.53 MIL/uL — ABNORMAL LOW (ref 3.87–5.11)
RDW: 15 % (ref 11.5–15.5)
WBC: 15.4 10*3/uL — ABNORMAL HIGH (ref 4.0–10.5)
nRBC: 0 % (ref 0.0–0.2)

## 2020-06-20 MED ORDER — ACETAMINOPHEN 325 MG PO TABS: 650.0000 mg | ORAL_TABLET | Freq: Four times a day (QID) | ORAL | Status: DC | PRN

## 2020-06-20 MED ORDER — FERROUS SULFATE 325 (65 FE) MG PO TABS: 325.0000 mg | ORAL_TABLET | Freq: Every day | ORAL | Status: DC

## 2020-06-20 MED ORDER — FERROUS SULFATE 325 (65 FE) MG PO TABS: 325.0000 mg | ORAL_TABLET | Freq: Every day | ORAL | 3 refills | Status: DC

## 2020-06-20 MED ORDER — IBUPROFEN 200 MG PO TABS: 600.0000 mg | ORAL_TABLET | Freq: Four times a day (QID) | ORAL | Status: DC | PRN

## 2020-06-20 NOTE — Anesthesia Postprocedure Evaluation (Signed)
Anesthesia Post Note  Patient: Aroura Vasudevan  Procedure(s) Performed: AN AD HOC LABOR EPIDURAL     Patient location during evaluation: Mother Baby Anesthesia Type: Epidural Level of consciousness: awake and alert Pain management: pain level controlled Vital Signs Assessment: post-procedure vital signs reviewed and stable Respiratory status: spontaneous breathing, nonlabored ventilation and respiratory function stable Cardiovascular status: stable Postop Assessment: no headache, no backache, epidural receding, no apparent nausea or vomiting, patient able to bend at knees, adequate PO intake and able to ambulate Anesthetic complications: no   No notable events documented.  Last Vitals:  Vitals:   06/19/20 1745 06/19/20 2209  BP: 109/76 105/71  Pulse: 62 69  Resp: 18 17  Temp: (!) 36.3 C 36.8 C  SpO2:  98%    Last Pain:  Vitals:   06/20/20 0500  TempSrc:   PainSc: 0-No pain   Pain Goal:                   [place]

## 2020-06-20 NOTE — Social Work (Signed)
MOB was referred for history of anxiety.   * Referral screened out by Clinical Social Worker because none of the following criteria appear to apply:  ~ History of anxiety/depression during this pregnancy, or of post-partum depression following prior delivery. No prenatal concerns noted. ~ Diagnosis of anxiety and/or depression within last 3 years. MOB previously reported being diagnosed in 2019, attributing the symptoms to being situational to job at the time. OR * MOB's symptoms currently being treated with medication and/or therapy.  MOB received a 2 on the Edinburgh Postpartum Depression Screen.  Please contact the Clinical Social Worker if needs arise or by MOB request.  Manfred Arch, LCSWA Clinical Social Work [employer] [facility] [employer]  [phone]

## 2020-06-20 NOTE — Discharge Summary (Signed)
Postpartum Discharge Summary  Date of Service updated 06/20/20      Patient Name: Gina Welch DOB: [birth year] MRN: [id number]  Date of admission: 06/18/2020 Delivery date:06/19/2020  Delivering provider: Allyn Kenner  Date of discharge: 06/20/2020  Admitting diagnosis: Normal labor and delivery [O80] Intrauterine pregnancy: [redacted]w[redacted]d    Secondary diagnosis:  Active Problems:   Normal labor and delivery  Additional problems: none    Discharge diagnosis: Term Pregnancy Delivered                                              Post partum procedures: none Augmentation: N/A Complications: None  Hospital course: Onset of Labor With Vaginal Delivery      40y.o. yo [id number] admitted in Active Labor on 06/18/2020. Patient had an uncomplicated labor course as follows:  Membrane Rupture Time/Date: 6:30 PM ,06/18/2020   Delivery Method:Vaginal, Spontaneous  Episiotomy: None  Lacerations:  None  Patient had an uncomplicated postpartum course.  She is ambulating, tolerating a regular diet, passing flatus, and urinating well. Patient is discharged home in stable condition on 06/20/20.  Newborn Data: Birth date:06/19/2020  Birth time:6:10 AM  Gender:Female  Living status:Living  Apgars:8 ,9  Weight:3245 g   Magnesium Sulfate received: No BMZ received: No Rhophylac:N/A MMR:N/A T-DaP:Given prenatally Flu: No Transfusion:No  Physical exam  Vitals:   06/19/20 1012 06/19/20 1745 06/19/20 2209 06/20/20 0806  BP: 119/73 109/76 105/71 (!) 96/57  Pulse: 73 62 69 70  Resp: 17 18 17 16   Temp: (!) 97.5 F (36.4 C) (!) 97.4 F (36.3 C) 98.3 F (36.8 C) 97.7 F (36.5 C)  TempSrc: Oral Oral Oral Oral  SpO2: 100%  98% 99%  Weight:      Height:       General: alert, cooperative, and no distress Lochia: appropriate Uterine Fundus: firm Incision: N/A DVT Evaluation: No evidence of DVT seen on physical exam. Labs: Lab Results  Component Value Date   WBC 15.4 (H)  06/20/2020   HGB 9.9 (L) 06/20/2020   HCT 31.0 (L) 06/20/2020   MCV 87.8 06/20/2020   PLT 154 06/20/2020   CMP Latest Ref Rng & Units 12/17/2019  Glucose 70 - 99 mg/dL 93  BUN 6 - 20 mg/dL 12  Creatinine 0.44 - 1.00 mg/dL 0.65  Sodium 135 - 145 mmol/L 132(L)  Potassium 3.5 - 5.1 mmol/L 4.0  Chloride 98 - 111 mmol/L 101  CO2 22 - 32 mmol/L 22  Calcium 8.9 - 10.3 mg/dL 9.3  Total Protein 6.5 - 8.1 g/dL 7.6  Total Bilirubin 0.3 - 1.2 mg/dL 0.2(L)  Alkaline Phos 38 - 126 U/L 54  AST 15 - 41 U/L 13(L)  ALT 0 - 44 U/L 11   Edinburgh Score: Edinburgh Postnatal Depression Scale Screening Tool 06/19/2020  I have been able to laugh and see the funny side of things. 0  I have looked forward with enjoyment to things. 0  I have blamed myself unnecessarily when things went wrong. 1  I have been anxious or worried for no good reason. 0  I have felt scared or panicky for no good reason. 0  Things have been getting on top of me. 1  I have been so unhappy that I have had difficulty sleeping. 0  I have felt sad or miserable. 0  I have been  so unhappy that I have been crying. 0  The thought of harming myself has occurred to me. 0  Edinburgh Postnatal Depression Scale Total 2      After visit meds:  Allergies as of 06/20/2020   No Known Allergies      Medication List     STOP taking these medications    cyclobenzaprine 10 MG tablet Commonly known as: FLEXERIL       TAKE these medications    acetaminophen 325 MG tablet Commonly known as: Tylenol Take 2 tablets (650 mg total) by mouth every 6 (six) hours as needed (for pain scale < 4).   calcium carbonate 500 MG chewable tablet Commonly known as: TUMS - dosed in mg elemental calcium Chew 3 tablets by mouth daily as needed for indigestion or heartburn.   Comfort Fit Maternity Supp Sm Misc 1 Units by Does not apply route daily as needed. [facility] [place], [place] [address] [phone]    ferrous sulfate 325 (65 FE) MG tablet Take 1 tablet (325 mg total) by mouth daily with breakfast.   ibuprofen 200 MG tablet Commonly known as: ADVIL Take 3 tablets (600 mg total) by mouth every 6 (six) hours as needed.   omeprazole 20 MG tablet Commonly known as: PRILOSEC OTC Take 20 mg by mouth daily.   ondansetron 4 MG disintegrating tablet Commonly known as: Zofran ODT Take 1-2 tablets (4-8 mg total) by mouth every 6 (six) hours as needed for nausea or vomiting.   Prenatal Gummies/DHA & FA 0.4-32.5 MG Chew Chew 3 each by mouth daily. Dispense Vitafol gummies         Discharge home in stable condition Infant Feeding: Breast Infant Disposition:home with mother Discharge instruction: per After Visit Summary and Postpartum booklet. Activity: Advance as tolerated. Pelvic rest for 6 weeks.  Diet: routine diet Anticipated Birth Control: Unsure Postpartum Appointment:4 weeks Additional Postpartum F/U:  none Future Appointments:No future appointments. Follow up Visit:  Follow-up Information     Allyn Kenner, DO. Schedule an appointment as soon as possible for a visit in 4 week(s).   Specialty: Obstetrics and Gynecology Contact information: [address] [place] [address] [phone]                     06/20/2020 Rowland Lathe, MD

## 2020-06-20 NOTE — Lactation Note (Signed)
This note was copied from a baby's chart. Lactation Consultation Note  Patient Name: Gina Welch HERDE'Y Date: 06/20/2020 Reason for consult: Follow-up assessment;Early term 65-38.6wks Age:39 hours   P4 mother whose infant is now 41 hours old.  This is an ETI at 38+0 weeks.  Mother breast fed her third child for 6 months.  Mother had some nipple pain yesterday, however, today she is feeling much better.  Baby has been latching better and mother is using coconut oil and comfort gels for relief.  She had no questions/concerns related to breast feeding.  Baby is voiding/stooling.  Mother will continue to feed on cue or at least 8-12 times/24 hours.  She would like to be discharged today.  Mother has a manual pump and a DEBP for home use.  No support person present at this time.     Maternal Data Has patient been taught Hand Expression?: Yes Does the patient have breastfeeding experience prior to this delivery?: Yes How long did the patient breastfeed?: 6 months with her last child  Feeding Mother's Current Feeding Choice: Breast Milk  LATCH Score                    Lactation Tools Discussed/Used    Interventions    Discharge Discharge Education: Engorgement and breast care Pump: Manual;Personal  Consult Status Consult Status: Complete Date: 06/20/20 Follow-up type: Call as needed    Lucia Mccreadie R Jawara Latorre 06/20/2020, 9:13 AM

## 2020-06-20 NOTE — Progress Notes (Signed)
Post Partum Day 1 Subjective: Gina Welch is doing well this morning without complaints. She is ambulating, voiding, tolerating PO. Breastfeeding. Minimal lochia. No chest pain, shortness of breath, calf pain.   Objective: Patient Vitals for the past 24 hrs:  BP Temp Temp src Pulse Resp SpO2  06/20/20 0806 (!) 96/57 97.7 F (36.5 C) Oral 70 16 99 %  06/19/20 2209 105/71 98.3 F (36.8 C) Oral 69 17 98 %  06/19/20 1745 109/76 (!) 97.4 F (36.3 C) Oral 62 18 --  06/19/20 1012 119/73 (!) 97.5 F (36.4 C) Oral 73 17 100 %    Physical Exam:  General: alert, cooperative, and no distress Lochia: appropriate Uterine Fundus: firm DVT Evaluation: No evidence of DVT seen on physical exam.  Recent Labs    06/18/20 2052 06/20/20 0451  WBC 11.5* 15.4*  HGB 11.9* 9.9*  HCT 37.8 31.0*  PLT 187 154    No results for input(s): NA, K, CL, CO2CT, BUN, CREATININE, GLUCOSE, BILITOT, ALT, AST, ALKPHOS, PROT, ALBUMIN in the last 72 hours.  No results for input(s): CALCIUM, MG, PHOS in the last 72 hours.  No results for input(s): PROTIME, APTT, INR in the last 72 hours.  No results for input(s): PROTIME, APTT, INR, FIBRINOGEN in the last 72 hours. Assessment/Plan: Gina Welch 40 y.o. [id number] PPD#1 sp SVD 1. PPC: continue routine postpartum care 2. Acute blood loss anemia: PO iron 3. Rh pos, rubella immune, s/p tdap and COVID vaccines 4. Desires neonatal circumcision, R/B/A of procedure discussed at length. Pt understands that neonatal circumcision is not considered medically necessary and is elective. The risks include, but are not limited to bleeding, infection, damage to the penis, development of scar tissue, and having to have it redone at a later date. Pt understands theses risks and wishes to proceed 5. Dispo: discharge home today. Discharge instructions reviewed   LOS: 2 days   Charlett Nose 06/20/2020, 9:26 AM

## 2020-06-23 ENCOUNTER — Emergency Department (HOSPITAL_BASED_OUTPATIENT_CLINIC_OR_DEPARTMENT_OTHER): Payer: Medicaid Other | Admitting: Radiology

## 2020-06-23 ENCOUNTER — Other Ambulatory Visit: Payer: Self-pay

## 2020-06-23 ENCOUNTER — Emergency Department (HOSPITAL_BASED_OUTPATIENT_CLINIC_OR_DEPARTMENT_OTHER)
Admission: EM | Admit: 2020-06-23 | Discharge: 2020-06-23 | Disposition: A | Discharge status: Home / Self Care | Payer: Medicaid Other | Attending: Emergency Medicine | Admitting: Emergency Medicine

## 2020-06-23 ENCOUNTER — Encounter (HOSPITAL_BASED_OUTPATIENT_CLINIC_OR_DEPARTMENT_OTHER): Payer: Self-pay | Admitting: Emergency Medicine

## 2020-06-23 DIAGNOSIS — R519 Headache, unspecified: Secondary | ICD-10-CM | POA: Insufficient documentation

## 2020-06-23 DIAGNOSIS — R030 Elevated blood-pressure reading, without diagnosis of hypertension: Secondary | ICD-10-CM | POA: Insufficient documentation

## 2020-06-23 DIAGNOSIS — R059 Cough, unspecified: Secondary | ICD-10-CM | POA: Insufficient documentation

## 2020-06-23 NOTE — ED Triage Notes (Addendum)
Pt arrives to ED with c/o of cough x3 weeks. The cough is dry most days however some days the cough is productive with clear phlegm. Pt denies rhinorrhea, sore throat, congestion, hoarseness, ear pain, fever, or chills. Pt recently gave birth to fourth child x5 days ago, at 37 weeks and six days. Pt reports cough worsened after birth of her child. Denies DOE and orthopnea. Pt reports no relief with Robitussin.

## 2020-06-23 NOTE — ED Provider Notes (Signed)
[facility] EMERGENCY DEPT Provider Note   CSN: [id number] Arrival date & time: 06/23/20  1006     History Chief Complaint  Patient presents with   Cough    Shacara Cozine is a 40 y.o. female.  HPI 40 year old female presents with cough.  She has had cough for about 3 weeks.  Mostly clear but occasionally some yellow sputum.  No fevers, trouble breathing, leg swelling, rhinorrhea/congestion or sore throat.  She tried Robitussin and Mucinex which is not helping.  She previously was on Claritin which also did not seem to help.  Last night worse so she came in.  Past Medical History:  Diagnosis Date   Bronchitis    Medical history non-contributory     Patient Active Problem List   Diagnosis Date Noted   Normal labor and delivery 06/18/2020   Indication for care in labor or delivery 03/12/2018   Pregnancy 03/12/2018   Supervision of other normal pregnancy, antepartum 08/27/2017    Past Surgical History:  Procedure Laterality Date   CERVICAL BIOPSY  W/ LOOP ELECTRODE EXCISION     NO PAST SURGERIES       OB History     Gravida  4   Para  4   Term  4   Preterm      AB      Living  4      SAB      IAB      Ectopic      Multiple  0   Live Births  4           Family History  Problem Relation Age of Onset   Hypertension Mother    Cancer Maternal Grandmother    Diabetes Paternal Grandfather     Social History   Tobacco Use   Smoking status: Never   Smokeless tobacco: Never  Vaping Use   Vaping Use: Never used  Substance Use Topics   Alcohol use: No   Drug use: No    Home Medications Prior to Admission medications   Medication Sig Start Date End Date Taking? Authorizing Provider  acetaminophen (TYLENOL) 325 MG tablet Take 2 tablets (650 mg total) by mouth every 6 (six) hours as needed (for pain scale < 4). 06/20/20   Charlett Nose, MD  calcium carbonate (TUMS - DOSED IN MG ELEMENTAL CALCIUM) 500 MG chewable tablet  Chew 3 tablets by mouth daily as needed for indigestion or heartburn.    [provider]  Elastic Bandages & Supports (COMFORT FIT MATERNITY SUPP SM) MISC 1 Units by Does not apply route daily as needed. [facility] [address] [place], [place] [address] [phone] 09/12/17   Raelyn Mora, CNM  ferrous sulfate 325 (65 FE) MG tablet Take 1 tablet (325 mg total) by mouth daily with breakfast. 06/20/20   Charlett Nose, MD  ibuprofen (ADVIL) 200 MG tablet Take 3 tablets (600 mg total) by mouth every 6 (six) hours as needed. 06/20/20   Charlett Nose, MD  omeprazole (PRILOSEC OTC) 20 MG tablet Take 20 mg by mouth daily.    [provider]  ondansetron (ZOFRAN ODT) 4 MG disintegrating tablet Take 1-2 tablets (4-8 mg total) by mouth every 6 (six) hours as needed for nausea or vomiting. 03/02/20   Gerrit Heck, CNM  Prenatal MV-Min-FA-Omega-3 (PRENATAL GUMMIES/DHA & FA) 0.4-32.5 MG CHEW Chew 3 each by mouth daily. Dispense Vitafol gummies 09/12/17   Raelyn Mora, CNM  Allergies    Patient has no known allergies.  Review of Systems   Review of Systems  Constitutional:  Negative for fever.  HENT:  Negative for congestion, postnasal drip, rhinorrhea and sore throat.   Respiratory:  Positive for cough. Negative for shortness of breath.   Cardiovascular:  Negative for leg swelling.  All other systems reviewed and are negative.  Physical Exam Updated Vital Signs BP 134/88 (BP Location: Right Arm)   Pulse 78   Temp 98.4 F (36.9 C) (Oral)   Resp 18   LMP 09/27/2019   SpO2 100%   Breastfeeding Yes   Physical Exam Vitals and nursing note reviewed.  Constitutional:      General: She is not in acute distress.    Appearance: She is well-developed. She is not ill-appearing or diaphoretic.  HENT:     Head: Normocephalic and atraumatic.     Right Ear: External ear normal.     Left Ear: External ear normal.     Nose: Nose normal.  Eyes:      General:        Right eye: No discharge.        Left eye: No discharge.  Cardiovascular:     Rate and Rhythm: Normal rate and regular rhythm.     Heart sounds: Normal heart sounds.  Pulmonary:     Effort: Pulmonary effort is normal.     Breath sounds: Normal breath sounds. No wheezing, rhonchi or rales.  Abdominal:     Palpations: Abdomen is soft.     Tenderness: There is no abdominal tenderness.  Skin:    General: Skin is warm and dry.  Neurological:     Mental Status: She is alert.  Psychiatric:        Mood and Affect: Mood is not anxious.    ED Results / Procedures / Treatments   Labs (all labs ordered are listed, but only abnormal results are displayed) Labs Reviewed - No data to display  EKG None  Radiology DG Chest 2 View  Result Date: 06/23/2020 CLINICAL DATA:  Cough 3 weeks EXAM: CHEST - 2 VIEW COMPARISON:  12/06/2011 FINDINGS: The heart size and mediastinal contours are within normal limits. Both lungs are clear. The visualized skeletal structures are unremarkable. IMPRESSION: No active cardiopulmonary disease. Electronically Signed   By: Marnee Spring M.D.   On: 06/23/2020 11:27    Procedures Procedures   Medications Ordered in ED Medications - No data to display  ED Course  I have reviewed the triage vital signs and the nursing notes.  Pertinent labs & imaging results that were available during my care of the patient were reviewed by me and considered in my medical decision making (see chart for details).    MDM Rules/Calculators/A&P                          Unclear cause of the patient's cough.  She has tried various different treatments.  Given clear lungs and clear x-ray, this might not be pulmonary and I have suggested that she go back on a PPI, which she was taking some during pregnancy as well as consider other treatments such as steroid nasal spray.  Otherwise, her initial blood pressure is a little elevated and she endorses a mild headache for the  last couple days.  On recheck it is 134/88.  I discussed the potential for something like preeclampsia and offered checking labs but she declines.  Think  this is reasonable but she will need to be followed up closely with her OB/GYN and get her blood pressure rechecked.  Any worsening of symptoms and she needs to be reevaluated. Final Clinical Impression(s) / ED Diagnoses Final diagnoses:  Cough  Elevated blood pressure reading    Rx / DC Orders ED Discharge Orders     None        Pricilla Loveless, MD 06/23/20 1157

## 2020-06-23 NOTE — Discharge Instructions (Addendum)
Your blood pressure is running a little high today.  You need to get this rechecked with your OB/GYN.  If you develop new or worsening headache, vision changes, or any other new/concerning symptoms then return to the ER for evaluation.

## 2020-06-28 ENCOUNTER — Inpatient Hospital Stay (HOSPITAL_COMMUNITY)
Admission: AD | Admit: 2020-06-28 | Discharge: 2020-06-29 | Disposition: A | Discharge status: Home / Self Care | Payer: Medicaid Other | Attending: Obstetrics & Gynecology | Admitting: Obstetrics & Gynecology

## 2020-06-28 ENCOUNTER — Other Ambulatory Visit: Payer: Self-pay

## 2020-06-28 ENCOUNTER — Encounter (HOSPITAL_COMMUNITY): Payer: Self-pay | Admitting: Obstetrics & Gynecology

## 2020-06-28 DIAGNOSIS — K429 Umbilical hernia without obstruction or gangrene: Secondary | ICD-10-CM

## 2020-06-28 DIAGNOSIS — R1031 Right lower quadrant pain: Secondary | ICD-10-CM

## 2020-06-28 DIAGNOSIS — Z79899 Other long term (current) drug therapy: Secondary | ICD-10-CM | POA: Insufficient documentation

## 2020-06-28 DIAGNOSIS — O9089 Other complications of the puerperium, not elsewhere classified: Secondary | ICD-10-CM | POA: Diagnosis not present

## 2020-06-28 DIAGNOSIS — R109 Unspecified abdominal pain: Secondary | ICD-10-CM | POA: Diagnosis not present

## 2020-06-28 DIAGNOSIS — O99893 Other specified diseases and conditions complicating puerperium: Secondary | ICD-10-CM | POA: Diagnosis not present

## 2020-06-28 DIAGNOSIS — R141 Gas pain: Secondary | ICD-10-CM

## 2020-06-28 DIAGNOSIS — K4020 Bilateral inguinal hernia, without obstruction or gangrene, not specified as recurrent: Secondary | ICD-10-CM

## 2020-06-28 LAB — CBC WITH DIFFERENTIAL/PLATELET
Abs Immature Granulocytes: 0.02 10*3/uL (ref 0.00–0.07)
Basophils Absolute: 0 10*3/uL (ref 0.0–0.1)
Basophils Relative: 0 %
Eosinophils Absolute: 0.2 10*3/uL (ref 0.0–0.5)
Eosinophils Relative: 2 %
HCT: 41.6 % (ref 36.0–46.0)
Hemoglobin: 13.2 g/dL (ref 12.0–15.0)
Immature Granulocytes: 0 %
Lymphocytes Relative: 22 %
Lymphs Abs: 2 10*3/uL (ref 0.7–4.0)
MCH: 28.1 pg (ref 26.0–34.0)
MCHC: 31.7 g/dL (ref 30.0–36.0)
MCV: 88.7 fL (ref 80.0–100.0)
Monocytes Absolute: 0.3 10*3/uL (ref 0.1–1.0)
Monocytes Relative: 3 %
Neutro Abs: 6.7 10*3/uL (ref 1.7–7.7)
Neutrophils Relative %: 73 %
Platelets: 291 10*3/uL (ref 150–400)
RBC: 4.69 MIL/uL (ref 3.87–5.11)
RDW: 15.1 % (ref 11.5–15.5)
WBC: 9.2 10*3/uL (ref 4.0–10.5)
nRBC: 0 % (ref 0.0–0.2)

## 2020-06-28 LAB — COMPREHENSIVE METABOLIC PANEL
ALT: 17 U/L (ref 0–44)
AST: 19 U/L (ref 15–41)
Albumin: 3.1 g/dL — ABNORMAL LOW (ref 3.5–5.0)
Alkaline Phosphatase: 86 U/L (ref 38–126)
Anion gap: 7 (ref 5–15)
BUN: 10 mg/dL (ref 6–20)
CO2: 22 mmol/L (ref 22–32)
Calcium: 9.2 mg/dL (ref 8.9–10.3)
Chloride: 107 mmol/L (ref 98–111)
Creatinine, Ser: 0.79 mg/dL (ref 0.44–1.00)
GFR, Estimated: >60 mL/min (ref >60)
Glucose, Bld: 97 mg/dL (ref 70–99)
Potassium: 4.6 mmol/L (ref 3.5–5.1)
Sodium: 136 mmol/L (ref 135–145)
Total Bilirubin: 0.4 mg/dL (ref 0.3–1.2)
Total Protein: 7 g/dL (ref 6.5–8.1)

## 2020-06-28 MED ORDER — SODIUM CHLORIDE 0.9 % IV SOLN: INTRAVENOUS | Status: DC

## 2020-06-28 MED ORDER — MORPHINE SULFATE (PF) 4 MG/ML IV SOLN
2.0000 mg | Freq: Once | INTRAVENOUS | Status: AC
Start: 2020-06-28 — End: 2020-06-28
  Administered 2020-06-28: 2 mg via INTRAVENOUS
  Filled 2020-06-28: qty 1

## 2020-06-28 NOTE — MAU Note (Signed)
Called CT and they are coming to get her now.

## 2020-06-28 NOTE — MAU Provider Note (Addendum)
History     CSN: [id number]  Arrival date and time: 06/28/20 2036   Event Date/Time   First Provider Initiated Contact with Patient 06/28/20 2126      Chief Complaint  Patient presents with   Abdominal Pain   Gina Welch is a 40 y.o. [id number] at 9 Days Postpartum who receives care at [facility].  She presents today for Abdominal Pain.  Patient states she started having right sided lower abdominal pain around 2 or 3 pm today.  She states it has gotten gradually worse throughout the day.  She states she has experienced this pain before during pregnancy, but contributed it to fetal position. She describes the pain as "like contractions," but states it initially started "like someone is stabbing me." She states it is constant and was unrelieved with tylenol which she took at 2pm and 8pm. She has not discovered any relieving or aggravating factors for the pain. She reports her bleeding had decreased recently, but when the pain started it increased. She states she has been having soft bowel movements throughout the day without any relief of her symptoms. She rates the pain a 10/10. Of note patient rocking back and forth, in bed while sitting crossed leg, during history collection.   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   OB History     Gravida  4   Para  4   Term  4   Preterm      AB      Living  4      SAB      IAB      Ectopic      Multiple  0   Live Births  4           Past Medical History:  Diagnosis Date   Bronchitis    Medical history non-contributory     Past Surgical History:  Procedure Laterality Date   CERVICAL BIOPSY  W/ LOOP ELECTRODE EXCISION     NO PAST SURGERIES      Family History  Problem Relation Age of Onset   Hypertension Mother    Cancer Maternal Grandmother    Diabetes Paternal Grandfather     Social History   Tobacco Use   Smoking status:  Never   Smokeless tobacco: Never  Vaping Use   Vaping Use: Never used  Substance Use Topics   Alcohol use: No   Drug use: No    Allergies: No Known Allergies  Medications Prior to Admission  Medication Sig Dispense Refill Last Dose   acetaminophen (TYLENOL) 325 MG tablet Take 2 tablets (650 mg total) by mouth every 6 (six) hours as needed (for pain scale < 4).   06/28/2020   ferrous sulfate 325 (65 FE) MG tablet Take 1 tablet (325 mg total) by mouth daily with breakfast.  3 06/28/2020   Prenatal MV-Min-FA-Omega-3 (PRENATAL GUMMIES/DHA & FA) 0.4-32.5 MG CHEW Chew 3 each by mouth daily. Dispense Vitafol gummies 90 tablet 12 06/28/2020   calcium carbonate (TUMS - DOSED IN MG ELEMENTAL CALCIUM) 500 MG chewable tablet Chew 3 tablets by mouth daily as needed for indigestion or heartburn.   Unknown   Elastic Bandages & Supports (COMFORT FIT MATERNITY SUPP SM) MISC 1 Units by Does not apply route daily as needed. [facility] [address] [place], [place] [address] [phone] 1 each 0    ibuprofen (ADVIL) 200  MG tablet Take 3 tablets (600 mg total) by mouth every 6 (six) hours as needed.   Unknown   omeprazole (PRILOSEC OTC) 20 MG tablet Take 20 mg by mouth daily.   Unknown   ondansetron (ZOFRAN ODT) 4 MG disintegrating tablet Take 1-2 tablets (4-8 mg total) by mouth every 6 (six) hours as needed for nausea or vomiting. 45 tablet 0 Unknown    Review of Systems  Constitutional:  Positive for chills (Earlier, None currently-"Didn't last long"). Negative for fever.  Eyes:  Negative for visual disturbance.  Gastrointestinal:  Positive for abdominal pain (RLQ). Negative for constipation, diarrhea, nausea and vomiting.  Genitourinary:  Positive for vaginal bleeding. Negative for difficulty urinating, dysuria and vaginal discharge.  Musculoskeletal:  Positive for back pain (Radiates to mid lower back).  Neurological:  Negative for dizziness, light-headedness and headaches.  Physical  Exam   Blood pressure 138/88, pulse 83, temperature 99.2 F (37.3 C), temperature source Oral, resp. rate 18, last menstrual period 09/27/2019, SpO2 99 %, currently breastfeeding.  Physical Exam Vitals reviewed.  Constitutional:      General: She is in acute distress.     Appearance: Normal appearance. She is well-developed.  HENT:     Head: Normocephalic and atraumatic.  Eyes:     Conjunctiva/sclera: Conjunctivae normal.  Cardiovascular:     Rate and Rhythm: Normal rate and regular rhythm.     Heart sounds: Normal heart sounds.  Pulmonary:     Effort: Pulmonary effort is normal. No respiratory distress.     Breath sounds: Normal breath sounds.  Abdominal:     Palpations: Abdomen is soft.     Tenderness: There is abdominal tenderness in the right lower quadrant, periumbilical area and suprapubic area. Positive signs include Rovsing's sign.     Comments: Patient becomes tearful with abdominal exam.   Skin:    General: Skin is warm and dry.  Neurological:     Mental Status: She is alert and oriented to person, place, and time.  Psychiatric:        Mood and Affect: Mood normal.        Behavior: Behavior normal.    MAU Course  Procedures No results found for this or any previous visit (from the past 24 hour(s)).  MDM Start IV with NS Infusion Labs: CBC, CMP Opioid Analgesic CT Scan-Abdomen Assessment and Plan  40 year old 9 Days Postpartum RLQ Pain R/O Appendicitis  -Exam performed and findings discussed. -Will start IV with NS and draw labs. -Patient offered and accepts pain medication. -Will give Morphine 2mg  now. -Will send for CT scan of abdomen. -Will await results.  Cherre RobinsJessica L Emly 06/28/2020, 9:26 PM   Reassessment (10:11 PM) Report given to M. Mayford KnifeWilliams, CNM for assumption of care.  Cherre RobinsJessica L Emly MSN, CNM Advanced Practice Provider, [facility]  Results for orders placed or performed during the hospital encounter of 06/28/20 (from  the past 24 hour(s))  CBC with Differential/Platelet     Status: None   Collection Time: 06/28/20  9:46 PM  Result Value Ref Range   WBC 9.2 4.0 - 10.5 K/uL   RBC 4.69 3.87 - 5.11 MIL/uL   Hemoglobin 13.2 12.0 - 15.0 g/dL   HCT 16.141.6 09.636.0 - 04.546.0 %   MCV 88.7 80.0 - 100.0 fL   MCH 28.1 26.0 - 34.0 pg   MCHC 31.7 30.0 - 36.0 g/dL   RDW 40.915.1 81.111.5 - 91.415.5 %   Platelets 291 150 - 400  K/uL   nRBC 0.0 0.0 - 0.2 %   Neutrophils Relative % 73 %   Neutro Abs 6.7 1.7 - 7.7 K/uL   Lymphocytes Relative 22 %   Lymphs Abs 2.0 0.7 - 4.0 K/uL   Monocytes Relative 3 %   Monocytes Absolute 0.3 0.1 - 1.0 K/uL   Eosinophils Relative 2 %   Eosinophils Absolute 0.2 0.0 - 0.5 K/uL   Basophils Relative 0 %   Basophils Absolute 0.0 0.0 - 0.1 K/uL   Immature Granulocytes 0 %   Abs Immature Granulocytes 0.02 0.00 - 0.07 K/uL  Comprehensive metabolic panel     Status: Abnormal   Collection Time: 06/28/20  9:46 PM  Result Value Ref Range   Sodium 136 135 - 145 mmol/L   Potassium 4.6 3.5 - 5.1 mmol/L   Chloride 107 98 - 111 mmol/L   CO2 22 22 - 32 mmol/L   Glucose, Bld 97 70 - 99 mg/dL   BUN 10 6 - 20 mg/dL   Creatinine, Ser 9.38 0.44 - 1.00 mg/dL   Calcium 9.2 8.9 - 18.2 mg/dL   Total Protein 7.0 6.5 - 8.1 g/dL   Albumin 3.1 (L) 3.5 - 5.0 g/dL   AST 19 15 - 41 U/L   ALT 17 0 - 44 U/L   Alkaline Phosphatase 86 38 - 126 U/L   Total Bilirubin 0.4 0.3 - 1.2 mg/dL   GFR, Estimated >99 >37 mL/min   Anion gap 7 5 - 15   DG Chest 2 View  Result Date: 06/23/2020 CLINICAL DATA:  Cough 3 weeks EXAM: CHEST - 2 VIEW COMPARISON:  12/06/2011 FINDINGS: The heart size and mediastinal contours are within normal limits. Both lungs are clear. The visualized skeletal structures are unremarkable. IMPRESSION: No active cardiopulmonary disease. Electronically Signed   By: Marnee Spring M.D.   On: 06/23/2020 11:27   CT ABDOMEN PELVIS W CONTRAST  Result Date: 06/29/2020 CLINICAL DATA:  Right lower quadrant abdominal  pain EXAM: CT ABDOMEN AND PELVIS WITH CONTRAST TECHNIQUE: Multidetector CT imaging of the abdomen and pelvis was performed using the standard protocol following bolus administration of intravenous contrast. CONTRAST:  OMNIPAQUE IOHEXOL 300 MG/ML  SOLN COMPARISON:  None. FINDINGS: Lower chest: No acute abnormality. Hepatobiliary: No focal liver abnormality is seen. No gallstones, gallbladder wall thickening, or biliary dilatation. Pancreas: Unremarkable Spleen: Unremarkable Adrenals/Urinary Tract: Adrenal glands are unremarkable. Kidneys are normal, without renal calculi, focal lesion, or hydronephrosis. Bladder is unremarkable. Stomach/Bowel: Stomach is within normal limits. Appendix appears normal. No evidence of bowel wall thickening, distention, or inflammatory changes. No free intraperitoneal gas or fluid. Vascular/Lymphatic: No significant vascular findings are present. No enlarged abdominal or pelvic lymph nodes. Reproductive: The uterus is enlarged and demonstrates mild hypervascularity in keeping with the given history of recent gestation. Hyperdense material within the endometrial cavity, best seen on sagittal imaging may represent hemorrhagic fluid, but is not optimally assessed on this examination. Hypoenhancing masses within the uterine fundus within the subserosal and intramural location likely represent multiple uterine fibroids. No adnexal masses are seen. Other: Tiny broad-based fat containing umbilical hernia. Tiny bilateral fat containing inguinal hernias. Musculoskeletal: No acute bone abnormality. IMPRESSION: Hypervascularity of an enlarged uterus in keeping with recent chest station. High attenuation material within the endometrial cavity may reflect hemorrhagic residual fluid, but is not well assessed on this examination. Superimposed uterine fundal fibroids. No acute intra-abdominal pathology identified. Electronically Signed   By: Helyn Numbers MD   On: 06/29/2020 01:15  Reviewed  results with patient Levsin and Simethicone given for possible gas/bloating as source of pain This did relieve her pain Zofran given for vomiting. Suspect she may be developing gastroenteritis.  A:  10 days postpartum       Abdominal pain, RLQ       No evidence for appendicitis       Possible gas pains vs early gastroenteritis       No leukocytosis, doubt endometritis       Incidental findings of tiny umbilical and inguinal hernias  P:  discharge home       Rx Zofran for nausea       Rx Levsin for colic/cramps       Rx Simethicone for gas pain       Advance diet as tolerated        Followup in office    Encouraged to return if she develops worsening of symptoms, increase in pain, fever, or other concerning symptoms.   Aviva Signs, CNM

## 2020-06-28 NOTE — MAU Note (Signed)
Pt c/o of sharp pain in lower right abdomen which began around 2015. Took Tylenol twice with no relief, pain is 8/10. Lochia is bright red. Had NSVD on 6/19.

## 2020-06-29 ENCOUNTER — Inpatient Hospital Stay (HOSPITAL_COMMUNITY): Payer: Medicaid Other

## 2020-06-29 DIAGNOSIS — R1031 Right lower quadrant pain: Secondary | ICD-10-CM | POA: Diagnosis not present

## 2020-06-29 DIAGNOSIS — O99893 Other specified diseases and conditions complicating puerperium: Secondary | ICD-10-CM | POA: Diagnosis not present

## 2020-06-29 DIAGNOSIS — R109 Unspecified abdominal pain: Secondary | ICD-10-CM | POA: Diagnosis not present

## 2020-06-29 MED ORDER — SIMETHICONE 80 MG PO CHEW
80.0000 mg | CHEWABLE_TABLET | Freq: Once | ORAL | Status: AC
  Administered 2020-06-29: 80 mg via ORAL
  Filled 2020-06-29: qty 1

## 2020-06-29 MED ORDER — ONDANSETRON 4 MG PO TBDP
4.0000 mg | ORAL_TABLET | Freq: Once | ORAL | Status: AC
  Administered 2020-06-29: 4 mg via ORAL
  Filled 2020-06-29: qty 1

## 2020-06-29 MED ORDER — ONDANSETRON 4 MG PO TBDP: 4.0000 mg | ORAL_TABLET | Freq: Four times a day (QID) | ORAL | 0 refills | Status: DC | PRN

## 2020-06-29 MED ORDER — HYDROMORPHONE HCL 1 MG/ML IJ SOLN
1.0000 mg | Freq: Once | INTRAMUSCULAR | Status: AC
  Administered 2020-06-29: 1 mg via INTRAVENOUS
  Filled 2020-06-29: qty 1

## 2020-06-29 MED ORDER — SIMETHICONE 80 MG PO CHEW: 80.0000 mg | CHEWABLE_TABLET | Freq: Four times a day (QID) | ORAL | 2 refills | Status: DC | PRN

## 2020-06-29 MED ORDER — IOHEXOL 300 MG/ML  SOLN
100.0000 mL | Freq: Once | INTRAMUSCULAR | Status: AC | PRN
  Administered 2020-06-29: 100 mL via INTRAVENOUS

## 2020-06-29 MED ORDER — HYOSCYAMINE SULFATE 0.125 MG SL SUBL
0.1250 mg | SUBLINGUAL_TABLET | Freq: Once | SUBLINGUAL | Status: AC
  Administered 2020-06-29: 0.125 mg via SUBLINGUAL
  Filled 2020-06-29: qty 1

## 2020-06-29 MED ORDER — HYOSCYAMINE SULFATE SL 0.125 MG SL SUBL: 1.0000 | SUBLINGUAL_TABLET | Freq: Two times a day (BID) | SUBLINGUAL | 0 refills | Status: DC | PRN

## 2020-06-29 NOTE — MAU Note (Signed)
Went into room to discharge patient. Patient requested an emesis bag and vomited. Provider notified and orders placed.

## 2020-07-26 DIAGNOSIS — Z124 Encounter for screening for malignant neoplasm of cervix: Secondary | ICD-10-CM | POA: Diagnosis not present

## 2020-07-28 DIAGNOSIS — Z3202 Encounter for pregnancy test, result negative: Secondary | ICD-10-CM | POA: Diagnosis not present

## 2020-07-28 DIAGNOSIS — Z3042 Encounter for surveillance of injectable contraceptive: Secondary | ICD-10-CM | POA: Diagnosis not present

## 2020-10-19 ENCOUNTER — Telehealth (HOSPITAL_COMMUNITY): Payer: Self-pay | Admitting: Lactation Services

## 2020-10-19 NOTE — Telephone Encounter (Signed)
This patient called due to decreased milk supply over the last month, pumping both breast with a DEBP every 3 hours ( 8 times in 24 hours ).  LC recommended to continue to pump 8 times a day and at least one of those pumping should be a power pump over 60 mins ( 10 mins on 10 mins off over 60 mins or 20 mins on 10 mins off over 60 mins ) for more stimulation to enhance milk supply.  Plenty of fluids, rest. LC reassured mom since her milk supply came in well in the beginning , the volume should increase. The volume may not be at the level it originally was since it has been 60 days.  LC also recommended low milk supply and or [email or web address] and search. LC encouraged mom to call back with questions.

## 2020-10-26 DIAGNOSIS — Z3042 Encounter for surveillance of injectable contraceptive: Secondary | ICD-10-CM | POA: Diagnosis not present

## 2021-01-20 DIAGNOSIS — Z3042 Encounter for surveillance of injectable contraceptive: Secondary | ICD-10-CM | POA: Diagnosis not present

## 2021-03-23 DIAGNOSIS — Z309 Encounter for contraceptive management, unspecified: Secondary | ICD-10-CM | POA: Diagnosis not present

## 2021-04-13 DIAGNOSIS — Z3042 Encounter for surveillance of injectable contraceptive: Secondary | ICD-10-CM | POA: Diagnosis not present

## 2021-05-08 ENCOUNTER — Encounter (HOSPITAL_BASED_OUTPATIENT_CLINIC_OR_DEPARTMENT_OTHER): Payer: Self-pay | Admitting: Obstetrics and Gynecology

## 2021-05-08 ENCOUNTER — Other Ambulatory Visit: Payer: Self-pay

## 2021-05-08 NOTE — Progress Notes (Signed)
Spoke w/ via phone for pre-op interview---pt  ?Lab needs dos----  UPT             ?Lab results------n/a ?COVID test -----patient states asymptomatic no test needed ?Arrive at -------1045 on05/11/2021 ?NPO after MN NO Solid Food.  Clear liquids from MN until--- ?Med rec completed ?Medications to take morning of surgery -----none ?Diabetic medication -----n/a ?Patient instructed no nail polish to be worn day of surgery ?Patient instructed to bring photo id and insurance card day of surgery ?Patient aware to have Driver (ride ) / caregiver husband Ronte   for 24 hours after surgery  ?Patient Special Instructions -----n/a ?Pre-Op special Istructions -----n/a ?Patient verbalized understanding of instructions that were given at this phone interview. ?Patient denies shortness of breath, chest pain, fever, cough at this phone interview.  ?

## 2021-05-11 ENCOUNTER — Other Ambulatory Visit: Payer: Self-pay

## 2021-05-11 ENCOUNTER — Ambulatory Visit (HOSPITAL_BASED_OUTPATIENT_CLINIC_OR_DEPARTMENT_OTHER): Payer: Medicaid Other | Admitting: Anesthesiology

## 2021-05-11 ENCOUNTER — Ambulatory Visit (HOSPITAL_BASED_OUTPATIENT_CLINIC_OR_DEPARTMENT_OTHER)
Admission: RE | Admit: 2021-05-11 | Discharge: 2021-05-11 | Disposition: A | Discharge status: Home / Self Care | Payer: Medicaid Other | Source: Ambulatory Visit | Attending: Obstetrics and Gynecology | Admitting: Obstetrics and Gynecology

## 2021-05-11 ENCOUNTER — Encounter (HOSPITAL_BASED_OUTPATIENT_CLINIC_OR_DEPARTMENT_OTHER): Payer: Self-pay | Admitting: Obstetrics and Gynecology

## 2021-05-11 ENCOUNTER — Encounter (HOSPITAL_BASED_OUTPATIENT_CLINIC_OR_DEPARTMENT_OTHER)
Admission: RE | Disposition: A | Discharge status: Home / Self Care | Payer: Self-pay | Source: Ambulatory Visit | Attending: Obstetrics and Gynecology

## 2021-05-11 DIAGNOSIS — Z302 Encounter for sterilization: Secondary | ICD-10-CM

## 2021-05-11 HISTORY — PX: LAPAROSCOPIC BILATERAL SALPINGECTOMY: SHX5889

## 2021-05-11 HISTORY — DX: Presence of spectacles and contact lenses: Z97.3

## 2021-05-11 HISTORY — DX: COVID-19: U07.1

## 2021-05-11 LAB — TYPE AND SCREEN
ABO/RH(D): AB POS
Antibody Screen: NEGATIVE

## 2021-05-11 LAB — POCT PREGNANCY, URINE: Preg Test, Ur: NEGATIVE

## 2021-05-11 SURGERY — SALPINGECTOMY, BILATERAL, LAPAROSCOPIC
Anesthesia: General | Site: Abdomen | Laterality: Bilateral

## 2021-05-11 MED ORDER — OXYCODONE HCL 5 MG PO TABS: 5.0000 mg | ORAL_TABLET | ORAL | Status: DC | PRN

## 2021-05-11 MED ORDER — FENTANYL CITRATE (PF) 100 MCG/2ML IJ SOLN
INTRAMUSCULAR | Status: AC
Start: 2021-05-11 — End: ?
  Filled 2021-05-11: qty 2

## 2021-05-11 MED ORDER — FENTANYL CITRATE (PF) 100 MCG/2ML IJ SOLN
INTRAMUSCULAR | Status: AC
  Filled 2021-05-11: qty 2

## 2021-05-11 MED ORDER — 0.9 % SODIUM CHLORIDE (POUR BTL) OPTIME
TOPICAL | Status: DC | PRN
  Administered 2021-05-11: 500 mL

## 2021-05-11 MED ORDER — ROCURONIUM BROMIDE 10 MG/ML (PF) SYRINGE
PREFILLED_SYRINGE | INTRAVENOUS | Status: DC | PRN
  Administered 2021-05-11: 60 mg via INTRAVENOUS

## 2021-05-11 MED ORDER — ACETAMINOPHEN 500 MG PO TABS
1000.0000 mg | ORAL_TABLET | Freq: Once | ORAL | Status: AC
  Administered 2021-05-11: 1000 mg via ORAL

## 2021-05-11 MED ORDER — HYDROMORPHONE HCL 2 MG/ML IJ SOLN
INTRAMUSCULAR | Status: AC
  Filled 2021-05-11: qty 1

## 2021-05-11 MED ORDER — SUGAMMADEX SODIUM 200 MG/2ML IV SOLN
INTRAVENOUS | Status: DC | PRN
  Administered 2021-05-11 (×2): 100 mg via INTRAVENOUS

## 2021-05-11 MED ORDER — ONDANSETRON HCL 4 MG/2ML IJ SOLN
INTRAMUSCULAR | Status: DC | PRN
Start: 2021-05-11 — End: 2021-05-11
  Administered 2021-05-11: 4 mg via INTRAVENOUS

## 2021-05-11 MED ORDER — ACETAMINOPHEN 500 MG PO TABS
ORAL_TABLET | ORAL | Status: AC
  Filled 2021-05-11: qty 2

## 2021-05-11 MED ORDER — OXYCODONE HCL 5 MG/5ML PO SOLN: 5.0000 mg | Freq: Once | ORAL | Status: AC | PRN

## 2021-05-11 MED ORDER — ONDANSETRON HCL 4 MG/2ML IJ SOLN
INTRAMUSCULAR | Status: AC
  Filled 2021-05-11: qty 2

## 2021-05-11 MED ORDER — OXYCODONE HCL 5 MG PO TABS: ORAL_TABLET | ORAL | 0 refills | Status: AC

## 2021-05-11 MED ORDER — PROPOFOL 10 MG/ML IV BOLUS
INTRAVENOUS | Status: DC | PRN
  Administered 2021-05-11: 170 mg via INTRAVENOUS

## 2021-05-11 MED ORDER — PROPOFOL 10 MG/ML IV BOLUS
INTRAVENOUS | Status: AC
Start: 2021-05-11 — End: ?
  Filled 2021-05-11: qty 20

## 2021-05-11 MED ORDER — HYDROMORPHONE HCL 1 MG/ML IJ SOLN
INTRAMUSCULAR | Status: DC | PRN
  Administered 2021-05-11: 1 mg via INTRAVENOUS

## 2021-05-11 MED ORDER — AMISULPRIDE (ANTIEMETIC) 5 MG/2ML IV SOLN
INTRAVENOUS | Status: AC
  Filled 2021-05-11: qty 4

## 2021-05-11 MED ORDER — CELECOXIB 200 MG PO CAPS
200.0000 mg | ORAL_CAPSULE | Freq: Once | ORAL | Status: AC
  Administered 2021-05-11: 200 mg via ORAL

## 2021-05-11 MED ORDER — MEPERIDINE HCL 25 MG/ML IJ SOLN: 6.2500 mg | INTRAMUSCULAR | Status: DC | PRN

## 2021-05-11 MED ORDER — CELECOXIB 200 MG PO CAPS
ORAL_CAPSULE | ORAL | Status: AC
  Filled 2021-05-11: qty 1

## 2021-05-11 MED ORDER — ROCURONIUM BROMIDE 10 MG/ML (PF) SYRINGE
PREFILLED_SYRINGE | INTRAVENOUS | Status: AC
  Filled 2021-05-11: qty 10

## 2021-05-11 MED ORDER — POVIDONE-IODINE 10 % EX SWAB: 2.0000 "application " | Freq: Once | CUTANEOUS | Status: DC

## 2021-05-11 MED ORDER — LACTATED RINGERS IV SOLN: INTRAVENOUS | Status: DC

## 2021-05-11 MED ORDER — AMISULPRIDE (ANTIEMETIC) 5 MG/2ML IV SOLN
10.0000 mg | Freq: Once | INTRAVENOUS | Status: AC
  Administered 2021-05-11: 10 mg via INTRAVENOUS

## 2021-05-11 MED ORDER — FENTANYL CITRATE (PF) 100 MCG/2ML IJ SOLN
INTRAMUSCULAR | Status: DC | PRN
  Administered 2021-05-11 (×2): 100 ug via INTRAVENOUS

## 2021-05-11 MED ORDER — ACETAMINOPHEN 325 MG PO TABS: 325.0000 mg | ORAL_TABLET | ORAL | Status: DC | PRN

## 2021-05-11 MED ORDER — MIDAZOLAM HCL 5 MG/5ML IJ SOLN
INTRAMUSCULAR | Status: DC | PRN
Start: 2021-05-11 — End: 2021-05-11
  Administered 2021-05-11: 2 mg via INTRAVENOUS

## 2021-05-11 MED ORDER — LIDOCAINE 2% (20 MG/ML) 5 ML SYRINGE
INTRAMUSCULAR | Status: DC | PRN
  Administered 2021-05-11: 80 mg via INTRAVENOUS

## 2021-05-11 MED ORDER — ACETAMINOPHEN 160 MG/5ML PO SOLN: 325.0000 mg | ORAL | Status: DC | PRN

## 2021-05-11 MED ORDER — OXYCODONE HCL 5 MG PO TABS
ORAL_TABLET | ORAL | Status: AC
  Filled 2021-05-11: qty 1

## 2021-05-11 MED ORDER — FENTANYL CITRATE (PF) 100 MCG/2ML IJ SOLN
25.0000 ug | INTRAMUSCULAR | Status: DC | PRN
  Administered 2021-05-11: 25 ug via INTRAVENOUS

## 2021-05-11 MED ORDER — OXYCODONE HCL 5 MG PO TABS
5.0000 mg | ORAL_TABLET | Freq: Once | ORAL | Status: AC | PRN
  Administered 2021-05-11: 5 mg via ORAL

## 2021-05-11 MED ORDER — DEXAMETHASONE SODIUM PHOSPHATE 10 MG/ML IJ SOLN
INTRAMUSCULAR | Status: DC | PRN
  Administered 2021-05-11: 10 mg via INTRAVENOUS

## 2021-05-11 MED ORDER — MIDAZOLAM HCL 2 MG/2ML IJ SOLN
INTRAMUSCULAR | Status: AC
  Filled 2021-05-11: qty 2

## 2021-05-11 MED ORDER — ONDANSETRON HCL 4 MG/2ML IJ SOLN
4.0000 mg | Freq: Once | INTRAMUSCULAR | Status: AC | PRN
  Administered 2021-05-11: 4 mg via INTRAVENOUS

## 2021-05-11 MED ORDER — BUPIVACAINE HCL (PF) 0.5 % IJ SOLN
INTRAMUSCULAR | Status: DC | PRN
  Administered 2021-05-11: 23 mL

## 2021-05-11 MED ORDER — DEXAMETHASONE SODIUM PHOSPHATE 10 MG/ML IJ SOLN
INTRAMUSCULAR | Status: AC
  Filled 2021-05-11: qty 1

## 2021-05-11 SURGICAL SUPPLY — 42 items
ADH SKN CLS APL DERMABOND .7 (GAUZE/BANDAGES/DRESSINGS) ×1
BAG RETRIEVAL 10 (BASKET)
CABLE HIGH FREQUENCY MONO STRZ (ELECTRODE) IMPLANT
COVER SURGICAL LIGHT HANDLE (MISCELLANEOUS) ×1 IMPLANT
DERMABOND ADVANCED (GAUZE/BANDAGES/DRESSINGS) ×1
DERMABOND ADVANCED .7 DNX12 (GAUZE/BANDAGES/DRESSINGS) ×1 IMPLANT
DEVICE TROCAR PUNCTURE CLOSURE (ENDOMECHANICALS) IMPLANT
DRSG OPSITE POSTOP 3X4 (GAUZE/BANDAGES/DRESSINGS) IMPLANT
DURAPREP 26ML APPLICATOR (WOUND CARE) ×2 IMPLANT
GAUZE 4X4 16PLY ~~LOC~~+RFID DBL (SPONGE) ×4 IMPLANT
GLOVE BIOGEL PI IND STRL 6.5 (GLOVE) ×2 IMPLANT
GLOVE BIOGEL PI IND STRL 7.0 (GLOVE) ×1 IMPLANT
GLOVE BIOGEL PI INDICATOR 6.5 (GLOVE) ×2
GLOVE BIOGEL PI INDICATOR 7.0 (GLOVE) ×1
GLOVE ECLIPSE 6.5 STRL STRAW (GLOVE) ×2 IMPLANT
GOWN STRL REUS W/TWL LRG LVL3 (GOWN DISPOSABLE) ×4 IMPLANT
KIT TURNOVER CYSTO (KITS) ×2 IMPLANT
LIGASURE VESSEL 5MM BLUNT TIP (ELECTROSURGICAL) ×1 IMPLANT
NEEDLE INSUFFLATION 120MM (ENDOMECHANICALS) ×2 IMPLANT
NS IRRIG 1000ML POUR BTL (IV SOLUTION) ×2 IMPLANT
PACK LAPAROSCOPY BASIN (CUSTOM PROCEDURE TRAY) ×2 IMPLANT
PACK TRENDGUARD 450 HYBRID PRO (MISCELLANEOUS) IMPLANT
PROTECTOR NERVE ULNAR (MISCELLANEOUS) ×4 IMPLANT
SET SUCTION IRRIG HYDROSURG (IRRIGATION / IRRIGATOR) IMPLANT
SET TUBE SMOKE EVAC HIGH FLOW (TUBING) ×2 IMPLANT
SLEEVE XCEL OPT CAN 5 100 (ENDOMECHANICALS) ×2 IMPLANT
SOLUTION ELECTROLUBE (MISCELLANEOUS) IMPLANT
SUT VIC AB 1 CT1 36 (SUTURE) IMPLANT
SUT VIC AB 2-0 UR6 27 (SUTURE) ×1 IMPLANT
SUT VIC AB 3-0 CT1 27 (SUTURE) ×2
SUT VIC AB 3-0 CT1 TAPERPNT 27 (SUTURE) IMPLANT
SUT VICRYL 0 UR6 27IN ABS (SUTURE) ×2 IMPLANT
SUT VICRYL RAPIDE 3 0 (SUTURE) ×2 IMPLANT
SYS BAG RETRIEVAL 10MM (BASKET)
SYSTEM BAG RETRIEVAL 10MM (BASKET) IMPLANT
TOWEL OR 17X26 10 PK STRL BLUE (TOWEL DISPOSABLE) ×4 IMPLANT
TRAY FOLEY W/BAG SLVR 14FR LF (SET/KITS/TRAYS/PACK) ×2 IMPLANT
TRENDGUARD 450 HYBRID PRO PACK (MISCELLANEOUS)
TROCAR BALLN 12MMX100 BLUNT (TROCAR) IMPLANT
TROCAR BLADELESS OPT 5 100 (ENDOMECHANICALS) ×2 IMPLANT
TROCAR XCEL NON-BLD 11X100MML (ENDOMECHANICALS) ×2 IMPLANT
WARMER LAPAROSCOPE (MISCELLANEOUS) ×2 IMPLANT

## 2021-05-11 NOTE — Transfer of Care (Signed)
Immediate Anesthesia Transfer of Care Note ? ?Patient: Gina Welch ? ?Procedure(s) Performed: LAPAROSCOPIC BILATERAL SALPINGECTOMY (Bilateral: Abdomen) ? ?Patient Location: PACU ? ?Anesthesia Type:General ? ?Level of Consciousness: drowsy, patient cooperative and responds to stimulation ? ?Airway & Oxygen Therapy: Patient Spontanous Breathing ? ?Post-op Assessment: Report given to RN and Post -op Vital signs reviewed and stable ? ?Post vital signs: Reviewed and stable ? ?Last Vitals:  ?Vitals Value Taken Time  ?BP 129/91 05/11/21 1330  ?Temp    ?Pulse 98 05/11/21 1332  ?Resp 16 05/11/21 1332  ?SpO2 97 % 05/11/21 1332  ?Vitals shown include unvalidated device data. ? ?Last Pain:  ?Vitals:  ? 05/11/21 1107  ?TempSrc: Oral  ?PainSc: 0-No pain  ?   ? ?Patients Stated Pain Goal: 5 (05/11/21 1107) ? ?Complications: No notable events documented. ?

## 2021-05-11 NOTE — Anesthesia Preprocedure Evaluation (Signed)
Anesthesia Evaluation  ?Patient identified by MRN, date of birth, ID band ?Patient awake ? ? ? ?Reviewed: ?Allergy & Precautions, H&P , NPO status , Patient's Chart, lab work & pertinent test results, reviewed documented beta blocker date and time  ? ?Airway ?Mallampati: I ? ?TM Distance: >3 FB ?Neck ROM: full ? ? ? Dental ?no notable dental hx. ?(+) Teeth Intact, Dental Advisory Given ?  ?Pulmonary ?neg pulmonary ROS,  ?  ?Pulmonary exam normal ?breath sounds clear to auscultation ? ? ? ? ? ? Cardiovascular ?Exercise Tolerance: Good ?negative cardio ROS ? ? ?Rhythm:regular Rate:Normal ? ? ?  ?Neuro/Psych ?negative neurological ROS ? negative psych ROS  ? GI/Hepatic ?negative GI ROS, Neg liver ROS,   ?Endo/Other  ?negative endocrine ROS ? Renal/GU ?negative Renal ROS  ?negative genitourinary ?  ?Musculoskeletal ? ? Abdominal ?  ?Peds ? Hematology ?negative hematology ROS ?(+)   ?Anesthesia Other Findings ? ? Reproductive/Obstetrics ?negative OB ROS ? ?  ? ? ? ? ? ? ? ? ? ? ? ? ? ?  ?  ? ? ? ? ? ? ? ? ?Anesthesia Physical ?Anesthesia Plan ? ?ASA: 1 ? ?Anesthesia Plan: General  ? ?Post-op Pain Management: Minimal or no pain anticipated  ? ?Induction: Intravenous ? ?PONV Risk Score and Plan: 3 and Ondansetron, Dexamethasone and Treatment may vary due to age or medical condition ? ?Airway Management Planned: Oral ETT ? ?Additional Equipment: None ? ?Intra-op Plan:  ? ?Post-operative Plan: Extubation in OR ? ?Informed Consent: I have reviewed the patients History and Physical, chart, labs and discussed the procedure including the risks, benefits and alternatives for the proposed anesthesia with the patient or authorized representative who has indicated his/her understanding and acceptance.  ? ? ? ?Dental Advisory Given ? ?Plan Discussed with: CRNA and Anesthesiologist ? ?Anesthesia Plan Comments:   ? ? ? ? ? ? ?Anesthesia Quick Evaluation ? ?

## 2021-05-11 NOTE — Op Note (Signed)
05/11/2021 ? ?1:28 PM ? ?PATIENT:  Gina Welch  41 y.o. female ? ?PRE-OPERATIVE DIAGNOSIS:  desired permanent sterilization with risk reduction procedure ? ?POST-OPERATIVE DIAGNOSIS:  same ? ?PROCEDURE:  Procedure(s) with comments: ?LAPAROSCOPIC BILATERAL SALPINGECTOMY (Bilateral) - requests procedure start time of 12:30 ? ?SURGEON:  Surgeon(s) and Role: ?   Philip Aspen, DO - Primary ?   Carrington Clamp, MD - Assisting  ? ?ANESTHESIA:   local and general ? ?EBL:  10 mL  ? ?BLOOD ADMINISTERED:none  ? ?LOCAL MEDICATIONS USED:  MARCAINE    and Amount: 10 ml ? ?SPECIMEN:  Source of Specimen:  bilateral fallopian tubes ? ?DISPOSITION OF SPECIMEN:  PATHOLOGY ? ?COUNTS:  YES ? ?PLAN OF CARE: Discharge to home after PACU ? ?PATIENT DISPOSITION:  PACU - hemodynamically stable. ? ?DESCRIPTION OF PROCEDURE: Patient was taken to the operating room where anesthesia was administered and found to be adequate.  She was prepped and draped in the normal sterile fashion in dorsal lithotomy position.  A speculum was placed after foley placed.  The anterior lip of the cervix was grasped with Hulka uterine manipulator as stem was introduced into uterus.  After drape placement I was alerted that manipulator fell off.  Speculum was placed again and minimal bleeding was noted where the hulka tore the cervix anteriorly.  An acorn manipulator was placed with single toothed tenaculum at anterior lip. ?Attention was turned to the abdomen that was previously prepped and draped.  Marcaine was placed subcutaneously at the imbilical site and bilateral lower quadrants sites for trocars.  A scalpel was used to make the vertical umbilical incision.  An optiview was used to advance the trocar under direct visualization.  Camera was advanced and proper entry noted.  The abdomen was then insufflated.  All four quadrants were surveyed with camera and noted to be normal.  Two additional skin incision were made with a scalpel in the bilateral  lower quadrants. 7mm tocars were advanced into abdomen in the left and right lower quadrants under direct visualization. Uterus and bilateral ovaries appeared normal.  On the right, a Ligasure device was used to cauterize and transect the mesosalpinx from the fimbriated end of the tube to the uterus, then the tube was transected at the uterus and the tube was removed under direct visualization.  The same procedure was performed on the left.  Excellent hemostasis was noted.  All instruments were removed and abdomen desufflated.  One figure of eight of 2-0 vicryl was placed to reapproximate the umbilical fascia.  Subcuticular skin approximation was performed for all three skin incisions with 3-0 vicryl.  Patient tolerated the procedure well. Sponge, lap and needle counts were correct x 2. Patient was taken to recovery in stable condition. ?  ?Delay start of Pharmacological VTE agent (>24hrs) due to surgical blood loss or risk of bleeding: not applicable ? ?

## 2021-05-11 NOTE — Discharge Instructions (Addendum)
DISCHARGE INSTRUCTIONS: Bilateral Laparoscopic Salpingectomy(removal of tubes) ? ?The following instructions have been prepared to help you care for yourself upon your return home today. ? ?Wound care: ? Do not get the incision wet for the first 24 hours. The incision should be kept clean and dry. ? The surgical glue used to keep your incision closed will start to flake off on its own after about a week.  DO NOT PULL IT OFF.  Let it come off on its own. ? Should the incision become sore, red, and swollen after the first week, check with your doctor. ? ?Personal hygiene: ? NO getting in the shower for 24 hours.  Shower the day after your procedure. ? ?Activity and limitations: ? Do NOT drive or operate any equipment today. ? Do NOT lift anything more than 15 pounds for 2-3 weeks after surgery. ? Do NOT rest in bed all day. ? Walking is encouraged. Walk each day, starting slowly with 5-minute walks 3 or 4 times a day. Slowly increase the length of your walks. ? Walk up and down stairs slowly. ? Do NOT do strenuous activities, such as golfing, playing tennis, bowling, running, biking, weight lifting, gardening, mowing, or vacuuming for 2-4 weeks. Ask your doctor when it is okay to start. ? ?Diet: Eat a light meal as desired this evening. You may resume your usual diet tomorrow. ? ?Return to work: This is dependent on the type of work you do. For the most part you can return to a desk job within a week of surgery. If you are more active at work, please discuss this with your doctor. ? ?What to expect after your surgery: You may have a slight burning sensation when you urinate on the first day. You may have a very small amount of blood in the urine. Expect to have a small amount of vaginal discharge/light bleeding for 1-2 weeks. It is not unusual to have abdominal soreness and bruising for up to 2 weeks. You may be tired and need more rest for about 1 week. You may experience shoulder pain for 24-72 hours. Lying flat in  bed may relieve it. ? ?Call your doctor for any of the following: ? Develop a fever of 100.4 or greater ? Inability to urinate 6 hours after discharge from hospital ? Severe pain not relieved by pain medications ? Persistent of heavy bleeding at incision site ? Redness or swelling around incision site after a week ? Increasing nausea or vomiting ? ? ?**You may begin taking Ibuprofen(or any other non-steroidal anti inflammatory medications) after 5:15 pm today** ?**You may begin taking Tylenol(Acetaminophen) after 5:15 pm today** ? ?Activity: ?Get plenty of rest for the remainder of the day. A responsible individual must stay with you for 24 hours following the procedure.  ?For the next 24 hours, DO NOT: ?-Drive a car ?-Advertising copywriter ?-Drink alcoholic beverages ?-Take any medication unless instructed by your physician ?-Make any legal decisions or sign important papers. ? ?Meals: ?Start with liquid foods such as gelatin or soup. Progress to regular foods as tolerated. Avoid greasy, spicy, heavy foods. If nausea and/or vomiting occur, drink only clear liquids until the nausea and/or vomiting subsides. Call your physician if vomiting continues. ? ?Special Instructions/Symptoms: ?Your throat may feel dry or sore from the anesthesia or the breathing tube placed in your throat during surgery. If this causes discomfort, gargle with warm salt water. The discomfort should disappear within 24 hours. ? ? ?

## 2021-05-11 NOTE — H&P (Signed)
41 y.o. [id number] presents for schedule diagnostic laparoscopy with bileratal salpingectomy for desired permanent sterilization with risk reduction. ? ?Past Medical History:  ?Diagnosis Date  ? Bronchitis   ? COVID   ? december 2022 had a cough for 1 week and all s/s are resolved 05/08/2021  ? Medical history non-contributory   ? Wears glasses   ? Anxiety and depression ? ?Past Surgical History:  ?Procedure Laterality Date  ? CERVICAL BIOPSY  W/ LOOP ELECTRODE EXCISION    ? NO PAST SURGERIES    ?  ?Social History  ? ?Socioeconomic History  ? Marital status: Married  ?  Spouse name: Not on file  ? Number of children: 2  ? Years of education: college  ? Highest education level: Associate degree: academic program  ?Occupational History  ? Not on file  ?Tobacco Use  ? Smoking status: Never  ? Smokeless tobacco: Never  ?Vaping Use  ? Vaping Use: Never used  ?Substance and Sexual Activity  ? Alcohol use: No  ? Drug use: No  ? Sexual activity: Not Currently  ?  Birth control/protection: None  ?Other Topics Concern  ? Not on file  ?Social History Narrative  ? Not on file  ? ?Social Determinants of Health  ? ?Financial Resource Strain: Not on file  ?Food Insecurity: Not on file  ?Transportation Needs: Not on file  ?Physical Activity: Not on file  ?Stress: Not on file  ?Social Connections: Not on file  ?Intimate Partner Violence: Not on file  ? ? ?No current facility-administered medications on file prior to encounter.  ? ?No current outpatient medications on file prior to encounter.  ? ? ?No Known Allergies ? ?Vitals:  ? 05/08/21 1209  ?Weight: 84.8 kg  ?Height: 5\' 4"  (1.626 m)  ? ? ?Per office exam: ?Lungs: clear to ascultation ?Cor:  RRR ?Abdomen:  soft, nontender, nondistended. ?Ex:  no cords, erythema ?Pelvic:  deferred to OR ? ?A:  Admit for same day surgery ? ? ?P: Dx lap with bilateral salpingectomy. All risks, benefits and alternatives d/w patient and she desires to proceed.   ?Routine pre-op care ?Antibiotics not  required ? ?  ?

## 2021-05-11 NOTE — Anesthesia Procedure Notes (Signed)
Procedure Name: Intubation ?Date/Time: 05/11/2021 12:31 PM ?Performed by: Rogers Blocker, CRNA ?Pre-anesthesia Checklist: Patient identified, Emergency Drugs available, Suction available and Patient being monitored ?Patient Re-evaluated:Patient Re-evaluated prior to induction ?Oxygen Delivery Method: Circle System Utilized ?Preoxygenation: Pre-oxygenation with 100% oxygen ?Induction Type: IV induction ?Ventilation: Mask ventilation without difficulty ?Laryngoscope Size: Mac and 3 ?Grade View: Grade I ?Tube type: Oral ?Tube size: 7.0 mm ?Number of attempts: 1 ?Airway Equipment and Method: Stylet and Oral airway ?Placement Confirmation: ETT inserted through vocal cords under direct vision, positive ETCO2 and breath sounds checked- equal and bilateral ?Secured at: 22 cm ?Tube secured with: Tape ?Dental Injury: Teeth and Oropharynx as per pre-operative assessment  ? ? ? ? ?

## 2021-05-12 ENCOUNTER — Encounter (HOSPITAL_BASED_OUTPATIENT_CLINIC_OR_DEPARTMENT_OTHER): Payer: Self-pay | Admitting: Obstetrics and Gynecology

## 2021-05-12 NOTE — Anesthesia Postprocedure Evaluation (Signed)
Anesthesia Post Note ? ?Patient: Gina Welch ? ?Procedure(s) Performed: LAPAROSCOPIC BILATERAL SALPINGECTOMY (Bilateral: Abdomen) ? ?  ? ?Patient location during evaluation: PACU ?Anesthesia Type: General ?Level of consciousness: awake and alert ?Pain management: pain level controlled ?Vital Signs Assessment: post-procedure vital signs reviewed and stable ?Respiratory status: spontaneous breathing, nonlabored ventilation, respiratory function stable and patient connected to nasal cannula oxygen ?Cardiovascular status: blood pressure returned to baseline and stable ?Postop Assessment: no apparent nausea or vomiting ?Anesthetic complications: no ? ? ?No notable events documented. ? ?Last Vitals:  ?Vitals:  ? 05/11/21 1530 05/11/21 1644  ?BP: (!) 127/117 119/81  ?Pulse: 66 70  ?Resp: 13   ?Temp: 36.7 ?C 36.4 ?C  ?SpO2: 97% 100%  ?  ?Last Pain:  ?Vitals:  ? 05/11/21 1644  ?TempSrc:   ?PainSc: 8   ? ?Pain Goal: Patients Stated Pain Goal: 5 (05/11/21 1107) ? ?  ?  ?  ?  ?  ?  ?  ? ?Camauri Craton ? ? ? ? ?

## 2021-05-15 LAB — SURGICAL PATHOLOGY

## 2021-07-12 ENCOUNTER — Emergency Department (HOSPITAL_BASED_OUTPATIENT_CLINIC_OR_DEPARTMENT_OTHER): Payer: Medicaid Other

## 2021-07-12 ENCOUNTER — Emergency Department (HOSPITAL_BASED_OUTPATIENT_CLINIC_OR_DEPARTMENT_OTHER)
Admission: EM | Admit: 2021-07-12 | Discharge: 2021-07-12 | Disposition: A | Discharge status: Home / Self Care | Payer: Medicaid Other | Attending: Emergency Medicine | Admitting: Emergency Medicine

## 2021-07-12 ENCOUNTER — Encounter (HOSPITAL_BASED_OUTPATIENT_CLINIC_OR_DEPARTMENT_OTHER): Payer: Self-pay

## 2021-07-12 ENCOUNTER — Other Ambulatory Visit: Payer: Self-pay

## 2021-07-12 DIAGNOSIS — G43101 Migraine with aura, not intractable, with status migrainosus: Secondary | ICD-10-CM | POA: Diagnosis not present

## 2021-07-12 DIAGNOSIS — R519 Headache, unspecified: Secondary | ICD-10-CM | POA: Diagnosis present

## 2021-07-12 MED ORDER — KETOROLAC TROMETHAMINE 15 MG/ML IJ SOLN
15.0000 mg | Freq: Once | INTRAMUSCULAR | Status: AC
  Administered 2021-07-12: 15 mg via INTRAVENOUS
  Filled 2021-07-12: qty 1

## 2021-07-12 MED ORDER — LACTATED RINGERS IV BOLUS
1000.0000 mL | Freq: Once | INTRAVENOUS | Status: AC
  Administered 2021-07-12: 1000 mL via INTRAVENOUS

## 2021-07-12 MED ORDER — METOCLOPRAMIDE HCL 5 MG/ML IJ SOLN
10.0000 mg | Freq: Once | INTRAMUSCULAR | Status: AC
  Administered 2021-07-12: 10 mg via INTRAVENOUS
  Filled 2021-07-12: qty 2

## 2021-07-12 MED ORDER — DIPHENHYDRAMINE HCL 50 MG/ML IJ SOLN
25.0000 mg | Freq: Once | INTRAMUSCULAR | Status: AC
  Administered 2021-07-12: 25 mg via INTRAVENOUS
  Filled 2021-07-12: qty 1

## 2021-07-12 NOTE — ED Triage Notes (Signed)
Pt presents POV for migraine x2 weeks, last night at 2300 pt had a sharp pain that made her feel nauseous and feeling faint.  Pt also c/o Left leg pain that shot up into her Left arm 6 days ago, reports her headache started after that. She had a tingling feeling at that time lasting 1-2 hours.   Pt ambulatory in triage, no neuro deficits noted

## 2021-07-12 NOTE — Discharge Instructions (Signed)
Your symptoms appear to be consistent with typical migraine symptoms.  If this happens again, you can try over-the-counter Excedrin to see if this helps relieve your symptoms.  If it becomes more frequent, follow-up with your PCP as she may be able to prescribe you medications going forward.  I am glad you are feeling better.

## 2021-07-12 NOTE — ED Provider Notes (Signed)
[facility] EMERGENCY DEPT Provider Note   CSN: [id number] Arrival date & time: 07/12/21  1254     History  Chief Complaint  Patient presents with   Headache    Gina Welch is a 41 y.o. female who presents to the emergency department for evaluation of left-sided migraine that has been ongoing for 2 weeks.  Patient has no previous history of migraine.  She states there is throbbing pain behind her left eye with occasional auras.  She has taken Tylenol which initially had mild resolution of symptoms although pain quickly returned.  She also is complaining of nausea and occasional lightheadedness that started about 2 to 3 days ago.  She also endorses some tingling in her bilateral upper extremities that started yesterday.  She has taken ibuprofen today which somewhat helped but has not completely resolved it.  She has photophobia of the left eye as well.  She denies chest pain, shortness of breath, abdominal pain, nausea, vomiting, diarrhea, seizures and syncope.   Headache Associated symptoms: eye pain and photophobia   Associated symptoms: no abdominal pain and no seizures        Home Medications Prior to Admission medications   Medication Sig Start Date End Date Taking? Authorizing Provider  oxyCODONE (OXY IR/ROXICODONE) 5 MG immediate release tablet Take 1-2 tabs every 4 hrs as needed for pain 05/11/21   Philip Aspen, DO      Allergies    Patient has no known allergies.    Review of Systems   Review of Systems  Eyes:  Positive for photophobia and pain.  Respiratory:  Negative for shortness of breath.   Cardiovascular:  Negative for chest pain.  Gastrointestinal:  Negative for abdominal pain.  Neurological:  Positive for headaches. Negative for seizures, syncope and speech difficulty.    Physical Exam Updated Vital Signs BP (!) 125/92   Pulse 66   Temp 98.4 F (36.9 C)   Resp 16   SpO2 99%  Physical Exam Vitals and nursing note reviewed.   Constitutional:      General: She is not in acute distress.    Appearance: She is not ill-appearing.  HENT:     Head: Atraumatic.  Eyes:     Extraocular Movements: Extraocular movements intact.     Conjunctiva/sclera: Conjunctivae normal.     Pupils: Pupils are equal, round, and reactive to light.     Comments: Pain when flashing light to left eye  Cardiovascular:     Rate and Rhythm: Normal rate and regular rhythm.     Pulses: Normal pulses.     Heart sounds: No murmur heard. Pulmonary:     Effort: Pulmonary effort is normal. No respiratory distress.     Breath sounds: Normal breath sounds.  Abdominal:     General: Abdomen is flat. There is no distension.     Palpations: Abdomen is soft.     Tenderness: There is no abdominal tenderness.  Musculoskeletal:        General: Normal range of motion.     Cervical back: Normal range of motion.  Skin:    General: Skin is warm and dry.     Capillary Refill: Capillary refill takes less than 2 seconds.  Neurological:     General: No focal deficit present.     Mental Status: She is alert.     Comments: Strong and equal grip strength.  Subjective sensation intact.  Strong straight leg raise against resistance  Psychiatric:  Mood and Affect: Mood normal.     ED Results / Procedures / Treatments   Labs (all labs ordered are listed, but only abnormal results are displayed) Labs Reviewed - No data to display  EKG None  Radiology CT Head Wo Contrast  Result Date: 07/12/2021 CLINICAL DATA:  Headache, sudden, severe EXAM: CT HEAD WITHOUT CONTRAST TECHNIQUE: Contiguous axial images were obtained from the base of the skull through the vertex without intravenous contrast. RADIATION DOSE REDUCTION: This exam was performed according to the departmental dose-optimization program which includes automated exposure control, adjustment of the mA and/or kV according to patient size and/or use of iterative reconstruction technique. COMPARISON:   12/17/2019 FINDINGS: Brain: No evidence of acute infarction, hemorrhage, hydrocephalus, extra-axial collection or mass lesion/mass effect. Vascular: No hyperdense vessel or unexpected calcification. Skull: Normal. Negative for fracture or focal lesion. Sinuses/Orbits: No acute finding. Other: None. IMPRESSION: No acute intracranial process. Electronically Signed   By: Duanne Guess D.O.   On: 07/12/2021 15:16    Procedures Procedures    Medications Ordered in ED Medications  lactated ringers bolus 1,000 mL (1,000 mLs Intravenous New Bag/Given 07/12/21 1644)  metoCLOPramide (REGLAN) injection 10 mg (10 mg Intravenous Given 07/12/21 1646)  ketorolac (TORADOL) 15 MG/ML injection 15 mg (15 mg Intravenous Given 07/12/21 1644)  diphenhydrAMINE (BENADRYL) injection 25 mg (25 mg Intravenous Given 07/12/21 1645)    ED Course/ Medical Decision Making/ A&P                           Medical Decision Making Amount and/or Complexity of Data Reviewed Radiology: ordered.  Risk Prescription drug management.   Social determinants of health:  Social History   Socioeconomic History   Marital status: Married    Spouse name: Not on file   Number of children: 2   Years of education: college   Highest education level: Associate degree: academic program  Occupational History   Not on file  Tobacco Use   Smoking status: Never   Smokeless tobacco: Never  Vaping Use   Vaping Use: Never used  Substance and Sexual Activity   Alcohol use: No   Drug use: No   Sexual activity: Not Currently    Birth control/protection: None  Other Topics Concern   Not on file  Social History Narrative   Not on file   Social Determinants of Health   Financial Resource Strain: Low Risk  (08/27/2017)   Overall Financial Resource Strain (CARDIA)    Difficulty of Paying Living Expenses: Not hard at all  Food Insecurity: No Food Insecurity (08/27/2017)   Hunger Vital Sign    Worried About Running Out of Food in the  Last Year: Never true    Ran Out of Food in the Last Year: Never true  Transportation Needs: No Transportation Needs (08/27/2017)   PRAPARE - Administrator, Civil Service (Medical): No    Lack of Transportation (Non-Medical): No  Physical Activity: Sufficiently Active (08/27/2017)   Exercise Vital Sign    Days of Exercise per Week: 5 days    Minutes of Exercise per Session: 90 min  Stress: No Stress Concern Present (08/27/2017)   [employer] of Occupational Health - Occupational Stress Questionnaire    Feeling of Stress : Not at all  Social Connections: Moderately Integrated (08/27/2017)   Social Connection and Isolation Panel [NHANES]    Frequency of Communication with Friends and Family: Three times a week  Frequency of Social Gatherings with Friends and Family: Three times a week    Attends Religious Services: More than 4 times per year    Active Member of Clubs or Organizations: No    Attends Banker Meetings: Never    Marital Status: Living with partner  Intimate Partner Violence: Not At Risk (08/27/2017)   Humiliation, Afraid, Rape, and Kick questionnaire    Fear of Current or Ex-Partner: No    Emotionally Abused: No    Physically Abused: No    Sexually Abused: No     Initial impression:  This patient presents to the ED for concern of left-sided headache, this involves an extensive number of treatment options, and is a complaint that carries with it a high risk of complications and morbidity.   Differentials include migraine, viral infection, tension headache, SAH CT head normal.   Comorbidities affecting care:  None  Additional history obtained: None   Imaging Studies ordered:  I ordered imaging studies including  CT head without acute findings I independently visualized and interpreted imaging and I agree with the radiologist interpretation.    Medicines ordered and prescription drug management:  I ordered medication  including: Migraine cocktail Reevaluation of the patient after these medicines showed that the patient resolved I have reviewed the patients home medicines and have made adjustments as needed    ED Course/Re-evaluation: Presents in no acute distress and is nontoxic-appearing.  Vitals without significant abnormality.  Neuro exam reassuring.  Pupils equal round and reactive to light although she does have some photosensitivity when testing pupillary reflexes in the left eye.  I ordered CT head given duration of symptoms which was negative.  Patient was given migraine migraine cocktail with resolution of symptoms.  She is feeling much better and is ready for discharge.  Advised if symptoms return she can try over-the-counter Excedrin Migraine and if they become more frequent she can follow-up with her PCP.  Patient expresses understanding is amenable to plan.  Disposition:  After consideration of the diagnostic results, physical exam, history and the patients response to treatment feel that the patent would benefit from discharge.   Migraine: Plan and management as described above. Discharged home in good condition.  Final Clinical Impression(s) / ED Diagnoses Final diagnoses:  Migraine with aura and with status migrainosus, not intractable    Rx / DC Orders ED Discharge Orders     None         Janell Quiet, PA-C 07/12/21 1727    Derwood Kaplan, MD 07/18/21 1653

## 2021-11-15 ENCOUNTER — Emergency Department (HOSPITAL_BASED_OUTPATIENT_CLINIC_OR_DEPARTMENT_OTHER): Payer: Medicaid Other

## 2021-11-15 ENCOUNTER — Encounter (HOSPITAL_BASED_OUTPATIENT_CLINIC_OR_DEPARTMENT_OTHER): Payer: Self-pay | Admitting: Emergency Medicine

## 2021-11-15 ENCOUNTER — Other Ambulatory Visit: Payer: Self-pay

## 2021-11-15 ENCOUNTER — Emergency Department (HOSPITAL_BASED_OUTPATIENT_CLINIC_OR_DEPARTMENT_OTHER)
Admission: EM | Admit: 2021-11-15 | Discharge: 2021-11-15 | Disposition: A | Discharge status: Home / Self Care | Payer: Medicaid Other | Attending: Emergency Medicine | Admitting: Emergency Medicine

## 2021-11-15 DIAGNOSIS — R0981 Nasal congestion: Secondary | ICD-10-CM | POA: Diagnosis not present

## 2021-11-15 DIAGNOSIS — Z1152 Encounter for screening for COVID-19: Secondary | ICD-10-CM | POA: Insufficient documentation

## 2021-11-15 DIAGNOSIS — R059 Cough, unspecified: Secondary | ICD-10-CM | POA: Insufficient documentation

## 2021-11-15 DIAGNOSIS — J209 Acute bronchitis, unspecified: Secondary | ICD-10-CM

## 2021-11-15 LAB — RESP PANEL BY RT-PCR (FLU A&B, COVID) ARPGX2
Influenza A by PCR: NEGATIVE
Influenza B by PCR: NEGATIVE
SARS Coronavirus 2 by RT PCR: NEGATIVE

## 2021-11-15 MED ORDER — BENZONATATE 100 MG PO CAPS: 100.0000 mg | ORAL_CAPSULE | Freq: Three times a day (TID) | ORAL | 0 refills | Status: AC

## 2021-11-15 MED ORDER — AZITHROMYCIN 250 MG PO TABS: 250.0000 mg | ORAL_TABLET | Freq: Every day | ORAL | 0 refills | Status: AC

## 2021-11-15 MED ORDER — AZITHROMYCIN 250 MG PO TABS
500.0000 mg | ORAL_TABLET | Freq: Once | ORAL | Status: AC
  Administered 2021-11-15: 500 mg via ORAL
  Filled 2021-11-15: qty 2

## 2021-11-15 NOTE — ED Provider Notes (Signed)
[facility] EMERGENCY DEPT Provider Note   CSN: [id number] Arrival date & time: 11/15/21  0147     History  Chief Complaint  Patient presents with   Cough    Gina Welch is a 41 y.o. female.  Patient is a 41 year old female with no significant past medical history.  Patient presenting with complaints of cough, congestion for the past week.  She has been taking multiple over-the-counter medications, but with little relief.  She denies any ill contacts.  She denies any fevers or chills.  The history is provided by the patient.       Home Medications Prior to Admission medications   Medication Sig Start Date End Date Taking? Authorizing Provider  oxyCODONE (OXY IR/ROXICODONE) 5 MG immediate release tablet Take 1-2 tabs every 4 hrs as needed for pain 05/11/21   Philip Aspen, DO      Allergies    Patient has no known allergies.    Review of Systems   Review of Systems  Respiratory:  Positive for cough.   All other systems reviewed and are negative.   Physical Exam Updated Vital Signs BP (!) 133/92 (BP Location: Right Arm)   Pulse 87   Temp 98.2 F (36.8 C)   Resp 20   Ht 5\' 4"  (1.626 m)   Wt 85.7 kg   LMP 11/10/2021   SpO2 97%   BMI 32.44 kg/m  Physical Exam Vitals and nursing note reviewed.  Constitutional:      General: She is not in acute distress.    Appearance: Normal appearance. She is well-developed. She is not diaphoretic.  HENT:     Head: Normocephalic and atraumatic.  Cardiovascular:     Rate and Rhythm: Normal rate and regular rhythm.     Heart sounds: No murmur heard.    No friction rub. No gallop.  Pulmonary:     Effort: Pulmonary effort is normal. No respiratory distress.     Breath sounds: Normal breath sounds. No wheezing.  Abdominal:     General: Bowel sounds are normal. There is no distension.     Palpations: Abdomen is soft.     Tenderness: There is no abdominal tenderness.  Musculoskeletal:        General:  Normal range of motion.     Cervical back: Normal range of motion and neck supple.  Skin:    General: Skin is warm and dry.  Neurological:     General: No focal deficit present.     Mental Status: She is alert and oriented to person, place, and time.     ED Results / Procedures / Treatments   Labs (all labs ordered are listed, but only abnormal results are displayed) Labs Reviewed  RESP PANEL BY RT-PCR (FLU A&B, COVID) [id number]    EKG None  Radiology DG Chest 1 View  Result Date: 11/15/2021 CLINICAL DATA:  Shortness of breath EXAM: CHEST  1 VIEW COMPARISON:  None Available. FINDINGS: The heart size and mediastinal contours are within normal limits. Both lungs are clear. The visualized skeletal structures are unremarkable. IMPRESSION: No active disease. Electronically Signed   By: 11/17/2021 M.D.   On: 11/15/2021 02:35    Procedures Procedures    Medications Ordered in ED Medications - No data to display  ED Course/ Medical Decision Making/ A&P  Patient presenting with URI symptoms that are ongoing for greater than 1 week.  Due to the duration of symptoms, I will prescribe antibiotics.  We will also prescribe  Tessalon and have patient follow-up as needed.  Final Clinical Impression(s) / ED Diagnoses Final diagnoses:  None    Rx / DC Orders ED Discharge Orders     None         Geoffery Lyons, MD 11/15/21 [phone]

## 2021-11-15 NOTE — ED Triage Notes (Signed)
Cough, chest congestion for over a week.

## 2021-11-15 NOTE — Discharge Instructions (Signed)
Begin taking Zithromax as prescribed.  Begin taking Tessalon as prescribed as needed for cough.  Continue other over-the-counter medications as needed for relief of symptoms.

## 2022-04-10 IMAGING — CT CT HEAD W/O CM
3 series · 16 of 47 positions shown, 19 images · non-contrast
Comparison: None.

CLINICAL DATA: Numbness and tingling right arm twelve weeks
pregnant

EXAM:
CT HEAD WITHOUT CONTRAST
TECHNIQUE: Contiguous axial images were obtained from the base of the skull
through the vertex without intravenous contrast.

[Series 2: head wo · axial · 0.41mm/px · z∈[-223,-98]mm · 10 of 31 slices shown, 13 images]
[im 3/31  brain]
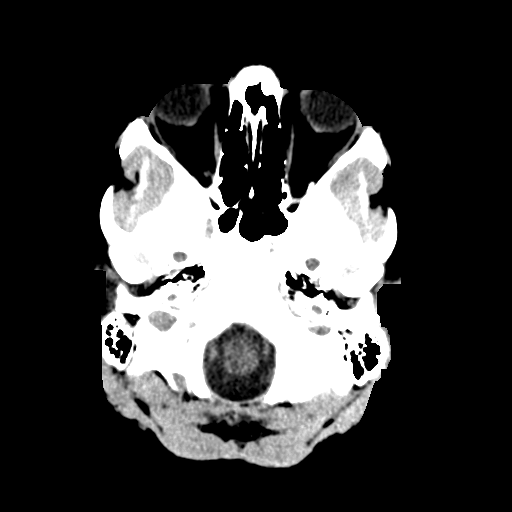
[im 3/31  bone]
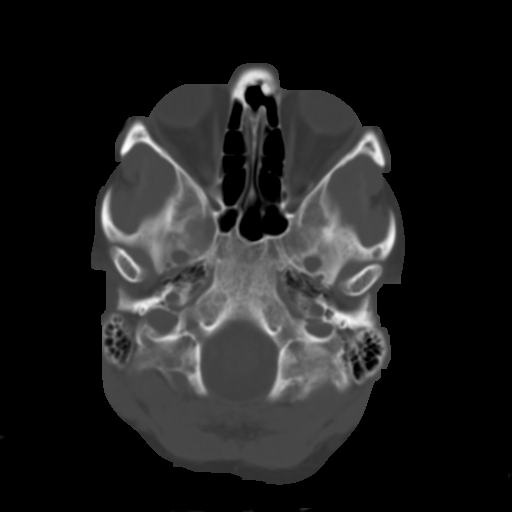
[im 6/31  brain]
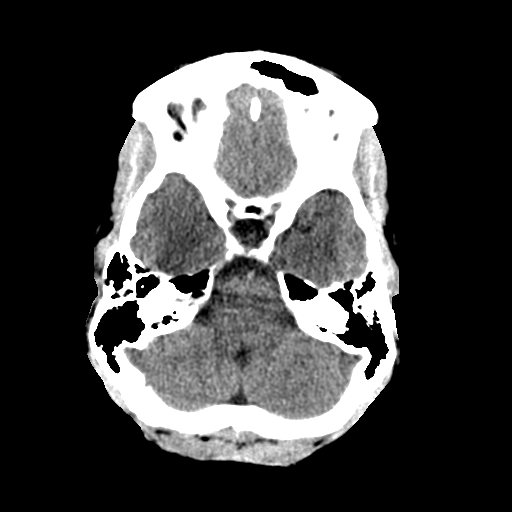
[im 9/31  brain]
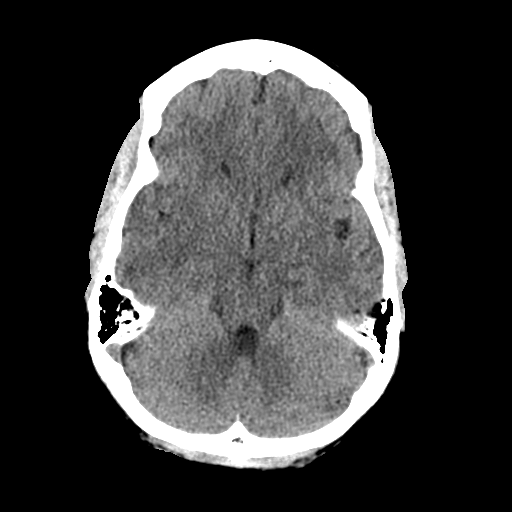
[im 11/31  brain]
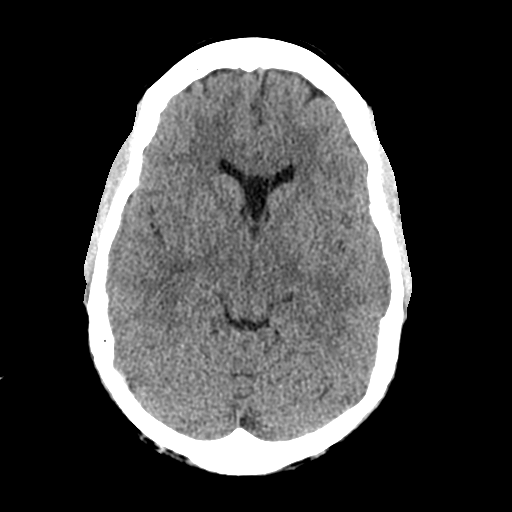
[im 14/31  brain]
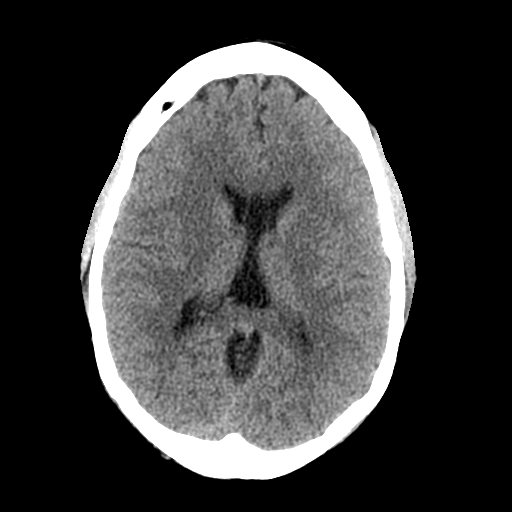
[im 14/31  bone]
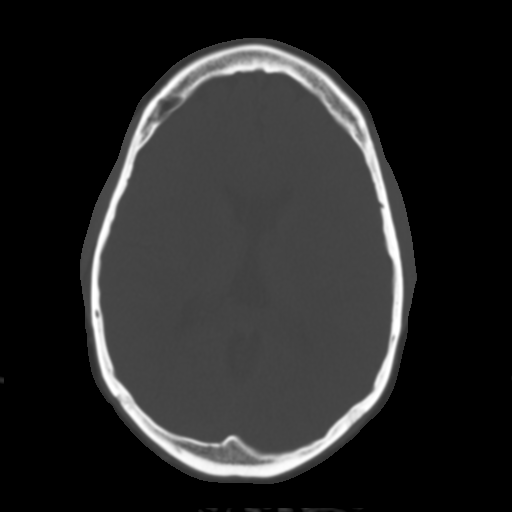
[im 17/31  brain]
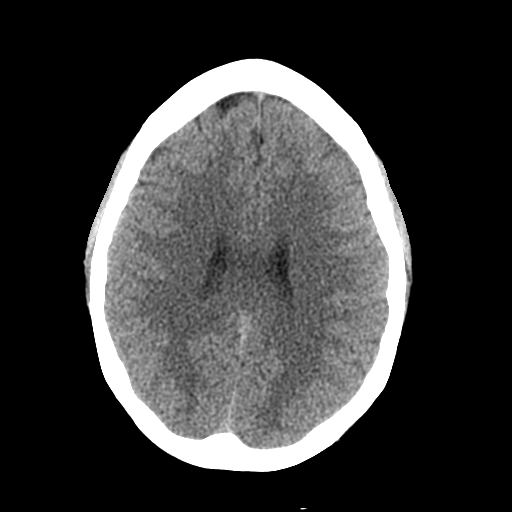
[im 20/31  brain]
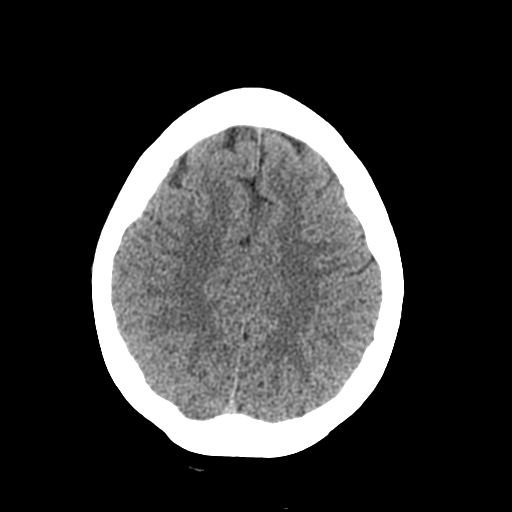
[im 23/31  brain]
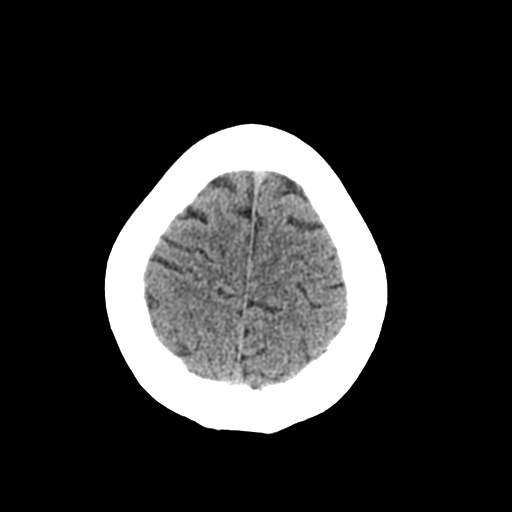
[im 25/31  brain]
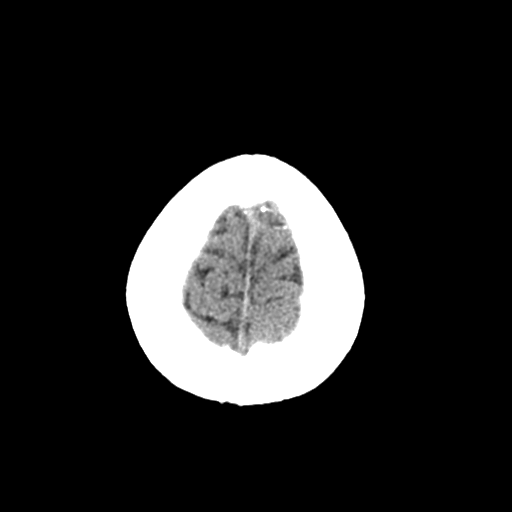
[im 25/31  bone]
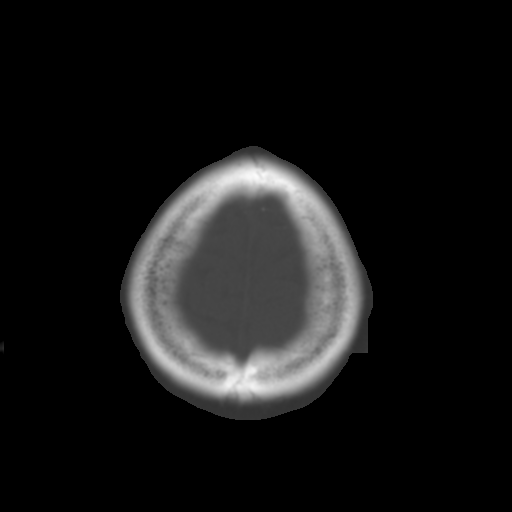
[im 28/31  brain]
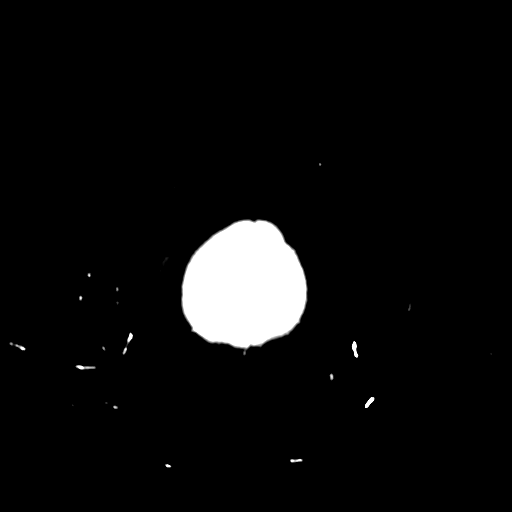

[Series 5: coronal soft tissue · coronal · 0.29mm/px · 3 of 64 slices shown]
[im 22/64  brain]
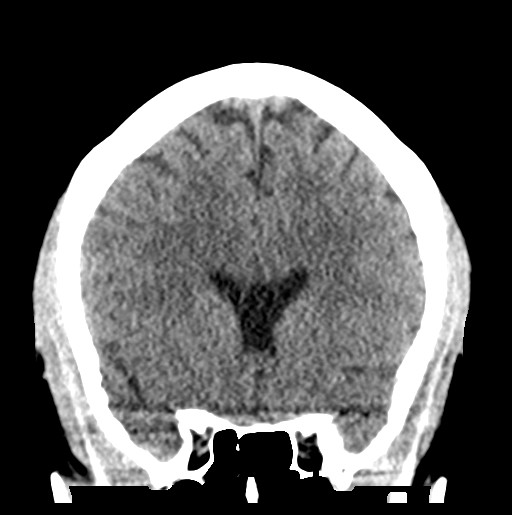
[im 29/64  brain]
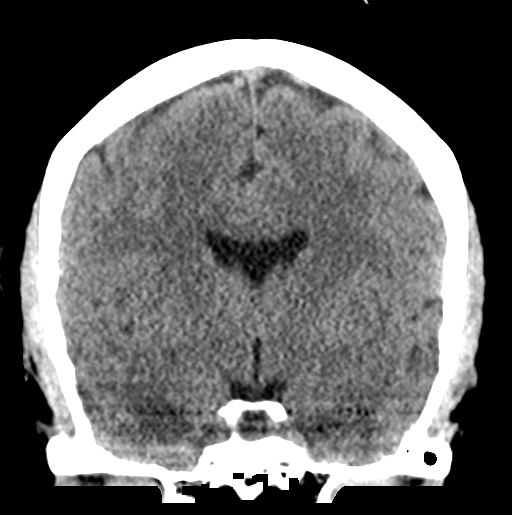
[im 36/64  brain]
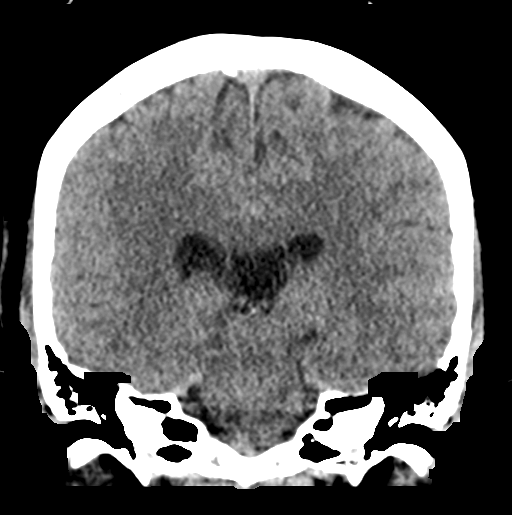

[Series 6: sagittal soft tissue · sagittal · 0.29mm/px · 3 of 51 slices shown]
[im 17/51  brain]
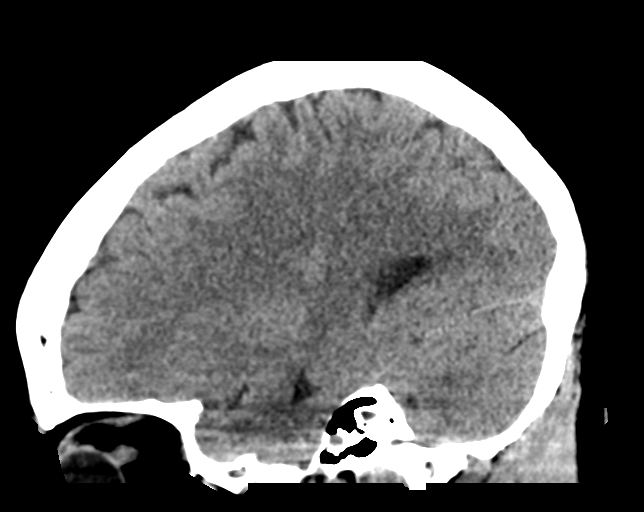
[im 26/51  brain]
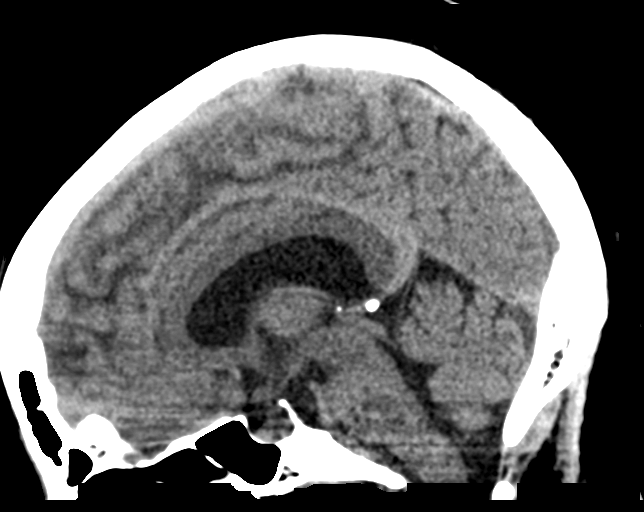
[im 34/51  brain]
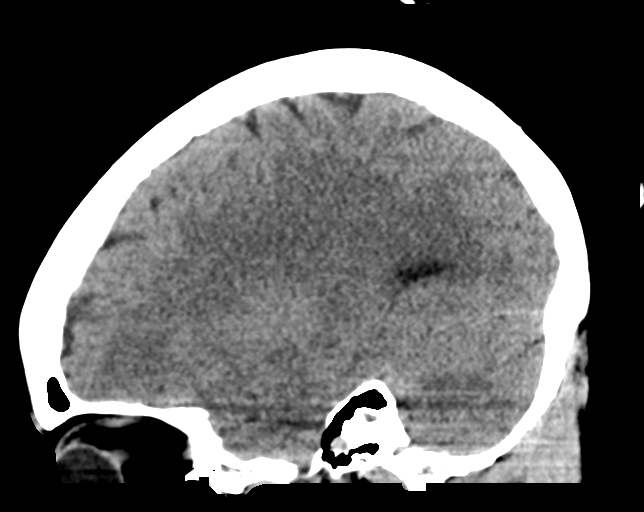

[16 of 47 positions shown; findings below may reference images not displayed]

FINDINGS: Brain: No evidence of acute infarction, hemorrhage, hydrocephalus,
extra-axial collection or mass lesion/mass effect.

Vascular: No hyperdense vessel or unexpected calcification.

Skull: Normal. Negative for fracture or focal lesion.

Sinuses/Orbits: No acute finding.

Other: None
IMPRESSION: Negative non contrasted CT appearance of the brain.

## 2022-09-05 ENCOUNTER — Other Ambulatory Visit: Payer: Self-pay

## 2022-09-05 ENCOUNTER — Emergency Department (HOSPITAL_BASED_OUTPATIENT_CLINIC_OR_DEPARTMENT_OTHER): Payer: Medicaid Other

## 2022-09-05 ENCOUNTER — Emergency Department (HOSPITAL_BASED_OUTPATIENT_CLINIC_OR_DEPARTMENT_OTHER)
Admission: EM | Admit: 2022-09-05 | Discharge: 2022-09-05 | Disposition: A | Discharge status: Home / Self Care | Payer: Medicaid Other | Attending: Emergency Medicine | Admitting: Emergency Medicine

## 2022-09-05 DIAGNOSIS — M79601 Pain in right arm: Secondary | ICD-10-CM | POA: Diagnosis not present

## 2022-09-05 DIAGNOSIS — R079 Chest pain, unspecified: Secondary | ICD-10-CM | POA: Diagnosis present

## 2022-09-05 DIAGNOSIS — R200 Anesthesia of skin: Secondary | ICD-10-CM | POA: Diagnosis not present

## 2022-09-05 DIAGNOSIS — R0789 Other chest pain: Secondary | ICD-10-CM | POA: Insufficient documentation

## 2022-09-05 LAB — CBC
HCT: 36.3 % (ref 36.0–46.0)
Hemoglobin: 11.7 g/dL — ABNORMAL LOW (ref 12.0–15.0)
MCH: 28.3 pg (ref 26.0–34.0)
MCHC: 32.2 g/dL (ref 30.0–36.0)
MCV: 87.9 fL (ref 80.0–100.0)
Platelets: 233 10*3/uL (ref 150–400)
RBC: 4.13 MIL/uL (ref 3.87–5.11)
RDW: 13.1 % (ref 11.5–15.5)
WBC: 6.8 10*3/uL (ref 4.0–10.5)
nRBC: 0 % (ref 0.0–0.2)

## 2022-09-05 LAB — BASIC METABOLIC PANEL
Anion gap: 7 (ref 5–15)
BUN: 11 mg/dL (ref 6–20)
CO2: 25 mmol/L (ref 22–32)
Calcium: 9.3 mg/dL (ref 8.9–10.3)
Chloride: 109 mmol/L (ref 98–111)
Creatinine, Ser: 0.96 mg/dL (ref 0.44–1.00)
GFR, Estimated: >60 mL/min (ref >60)
Glucose, Bld: 89 mg/dL (ref 70–99)
Potassium: 4.3 mmol/L (ref 3.5–5.1)
Sodium: 141 mmol/L (ref 135–145)

## 2022-09-05 LAB — TROPONIN I (HIGH SENSITIVITY): Troponin I (High Sensitivity): <2 ng/L (ref <18)

## 2022-09-05 MED ORDER — LIDOCAINE 5 % EX PTCH
1.0000 | MEDICATED_PATCH | CUTANEOUS | Status: DC
  Administered 2022-09-05: 1 via TRANSDERMAL
  Filled 2022-09-05: qty 1

## 2022-09-05 MED ORDER — KETOROLAC TROMETHAMINE 30 MG/ML IJ SOLN
30.0000 mg | Freq: Once | INTRAMUSCULAR | Status: AC
  Administered 2022-09-05: 30 mg via INTRAVENOUS
  Filled 2022-09-05: qty 1

## 2022-09-05 NOTE — Discharge Instructions (Addendum)
As discussed, your labs and imaging do not indicate ischemic changes to your heart - no signs of a heart attack.  Your pains may be related to inflammation of the chest wall.  You can use over-the-counter lidocaine patches.  Place 1 patch on the area of pain 1 time a day.  Additionally, I have sent a referral for cardiology for further evaluation of your symptoms.  They will give you a call in the next couple days to schedule an appointment.  Get help right away if: You feel sick to your stomach (nauseous) or you throw up (vomit). You feel sweaty or light-headed. You have a cough with mucus from your lungs (sputum) or you cough up blood. You are short of breath.

## 2022-09-05 NOTE — ED Triage Notes (Signed)
Reports chest pain x3 weeks. Central pressure. Intermittent sharp pains. Some right arm involvment at time. Reports Hx anxiety. -SOB, -V-N+D.

## 2022-09-05 NOTE — ED Provider Notes (Signed)
[place] EMERGENCY DEPARTMENT AT [facility] Provider Note   CSN: [id number] Arrival date & time: 09/05/22  1642     History  Chief Complaint  Patient presents with   Chest Pain    Gina Welch is a 42 y.o. female with no significant past medical history who presents the ED today for chest pain.  Patient reports intermittent tightness to the right side of her chest for the past 3 weeks with occasional sharp, shooting pains.  She said the sensation usually lasts seconds to minutes and is self-limiting.  She reports 1 episode of associated right arm pain and numbness.  She has tried ibuprofen with some relief of the tightness.  She has been using heat as well as she thinks it could possibly be due to a muscle strain or pinched nerve.  Pain is not positional, or worse with deep breathing or movement.  Denies associated shortness of breath, nausea, vomiting, dizziness, or weakness.  Pain does not radiate to the back.  She has never experienced anything like this before.  She has an appointment with her primary care scheduled for next week to follow-up for these symptoms but was advised from her family to get evaluated at the ED today.  Denies any other complaints or concerns at this time.    Home Medications Prior to Admission medications   Medication Sig Start Date End Date Taking? Authorizing Provider  azithromycin (ZITHROMAX) 250 MG tablet Take 1 tablet (250 mg total) by mouth daily. 11/15/21   Geoffery Lyons, MD  benzonatate (TESSALON) 100 MG capsule Take 1 capsule (100 mg total) by mouth every 8 (eight) hours. 11/15/21   Geoffery Lyons, MD  oxyCODONE (OXY IR/ROXICODONE) 5 MG immediate release tablet Take 1-2 tabs every 4 hrs as needed for pain 05/11/21   Philip Aspen, DO      Allergies    Patient has no known allergies.    Review of Systems   Review of Systems  Cardiovascular:  Positive for chest pain.  All other systems reviewed and are negative.   Physical  Exam Updated Vital Signs BP 120/83 (BP Location: Right Arm)   Pulse 71   Temp 97.9 F (36.6 C) (Oral)   Resp 16   Ht 5\' 5"  (1.651 m)   Wt 81.6 kg   SpO2 100%   BMI 29.95 kg/m  Physical Exam Vitals and nursing note reviewed.  Constitutional:      General: She is not in acute distress.    Appearance: Normal appearance.  HENT:     Head: Normocephalic and atraumatic.     Mouth/Throat:     Mouth: Mucous membranes are moist.  Eyes:     Conjunctiva/sclera: Conjunctivae normal.     Pupils: Pupils are equal, round, and reactive to light.  Cardiovascular:     Rate and Rhythm: Normal rate and regular rhythm.     Pulses: Normal pulses.     Heart sounds: Normal heart sounds.  Pulmonary:     Effort: Pulmonary effort is normal.     Breath sounds: Normal breath sounds.  Abdominal:     Palpations: Abdomen is soft.     Tenderness: There is no abdominal tenderness.  Skin:    General: Skin is warm and dry.     Findings: No rash.  Neurological:     General: No focal deficit present.     Mental Status: She is alert.     Sensory: No sensory deficit.     Motor: No weakness.  Psychiatric:        Mood and Affect: Mood normal.        Behavior: Behavior normal.     ED Results / Procedures / Treatments   Labs (all labs ordered are listed, but only abnormal results are displayed) Labs Reviewed  CBC - Abnormal; Notable for the following components:      Result Value   Hemoglobin 11.7 (*)    All other components within normal limits  BASIC METABOLIC PANEL  TROPONIN I (HIGH SENSITIVITY)    EKG EKG Interpretation Date/Time:  Wednesday September 05 2022 16:52:55 EDT Ventricular Rate:  68 PR Interval:  166 QRS Duration:  82 QT Interval:  392 QTC Calculation: 416 R Axis:   35  Text Interpretation: Normal sinus rhythm Normal ECG No previous ECGs available Confirmed by Cathren Laine ([id number]) on 09/05/2022 5:02:13 PM  Radiology DG Chest Port 1 View  Result Date: 09/05/2022 CLINICAL  DATA:  Chest pain x3 weeks. EXAM: PORTABLE CHEST 1 VIEW COMPARISON:  November 15, 2021 FINDINGS: The heart size and mediastinal contours are within normal limits. There is no evidence of an acute infiltrate, pleural effusion or pneumothorax. The visualized skeletal structures are unremarkable. IMPRESSION: No active cardiopulmonary disease. Electronically Signed   By: Aram Candela M.D.   On: 09/05/2022 18:37    Procedures Procedures: not indicated.   Medications Ordered in ED Medications  lidocaine (LIDODERM) 5 % 1 patch (1 patch Transdermal Patch Applied 09/05/22 1742)  ketorolac (TORADOL) 30 MG/ML injection 30 mg (30 mg Intravenous Given 09/05/22 1742)    ED Course/ Medical Decision Making/ A&P                                 Medical Decision Making Amount and/or Complexity of Data Reviewed Labs: ordered. Radiology: ordered.  Risk Prescription drug management.   This patient presents to the ED for concern of chest pain, this involves an extensive number of treatment options, and is a complaint that carries with it a high risk of complications and morbidity.   Differential diagnosis includes: ACS, pneumonia, pneumothorax, pleural effusion, costochondritis, pleurisy, atypical chest pain, etc.   Comorbidities  No significant past medical history   Additional History  Additional history obtained from patient's previous records.   Cardiac Monitoring / EKG  The patient was maintained on a cardiac monitor.  I personally viewed and interpreted the cardiac monitored which showed: Normal sinus rhythm with a heart rate of 68 bpm.   Lab Tests  I ordered and personally interpreted labs.  The pertinent results include:   Initial troponin of less than 2 BMP is unremarkable - no acute electrolyte abnormality or AKI CBC is unremarkable - no acute infection or anemia   Imaging Studies  I ordered imaging studies including CXR  I independently visualized and interpreted imaging  which showed: No active cardiopulmonary disease. I agree with the radiologist interpretation   Problem List / ED Course / Critical Interventions / Medication Management  Chest pain I ordered medications including: Toradol and lidocaine patch for pain Reevaluation of the patient after these medicines showed that the patient resolved I have reviewed the patients home medicines and have made adjustments as needed   Social Determinants of Health  Access to healthcare   Test / Admission - Considered  Discussed results with patient. She is hemodynamically stable and safe for discharge home. Referral for cardiology provided for patient to follow-up with. Return  precautions provided.       Final Clinical Impression(s) / ED Diagnoses Final diagnoses:  Atypical chest pain    Rx / DC Orders ED Discharge Orders          Ordered    Ambulatory referral to Cardiology        09/05/22 1918              Maxwell Marion, PA-C 09/05/22 1921    Cathren Laine, MD 09/05/22 [phone]

## 2023-07-31 ENCOUNTER — Emergency Department (HOSPITAL_BASED_OUTPATIENT_CLINIC_OR_DEPARTMENT_OTHER): Admission: EM | Admit: 2023-07-31 | Discharge: 2023-07-31 | Discharge status: Against Medical Advice

## 2023-07-31 ENCOUNTER — Other Ambulatory Visit: Payer: Self-pay

## 2023-08-02 ENCOUNTER — Other Ambulatory Visit: Payer: Self-pay | Admitting: Obstetrics and Gynecology

## 2023-08-02 DIAGNOSIS — R928 Other abnormal and inconclusive findings on diagnostic imaging of breast: Secondary | ICD-10-CM

## 2023-08-07 ENCOUNTER — Ambulatory Visit
Admission: RE | Admit: 2023-08-07 | Discharge: 2023-08-07 | Disposition: A | Discharge status: Home / Self Care | Source: Ambulatory Visit | Attending: Obstetrics and Gynecology | Admitting: Obstetrics and Gynecology

## 2023-08-07 DIAGNOSIS — R928 Other abnormal and inconclusive findings on diagnostic imaging of breast: Secondary | ICD-10-CM
